# Patient Record
Sex: Female | Born: 1935
Health system: Southern US, Community
[De-identification: ages and names within clinical notes are randomized; demographics above are authoritative.]

## PROBLEM LIST (undated history)

## (undated) DIAGNOSIS — Z9889 Other specified postprocedural states: Secondary | ICD-10-CM

## (undated) DIAGNOSIS — I10 Essential (primary) hypertension: Secondary | ICD-10-CM

## (undated) DIAGNOSIS — T4145XA Adverse effect of unspecified anesthetic, initial encounter: Secondary | ICD-10-CM

## (undated) DIAGNOSIS — I499 Cardiac arrhythmia, unspecified: Secondary | ICD-10-CM

## (undated) DIAGNOSIS — Z923 Personal history of irradiation: Secondary | ICD-10-CM

## (undated) DIAGNOSIS — Z789 Other specified health status: Secondary | ICD-10-CM

## (undated) DIAGNOSIS — C50919 Malignant neoplasm of unspecified site of unspecified female breast: Secondary | ICD-10-CM

## (undated) DIAGNOSIS — C801 Malignant (primary) neoplasm, unspecified: Secondary | ICD-10-CM

## (undated) DIAGNOSIS — D649 Anemia, unspecified: Secondary | ICD-10-CM

## (undated) DIAGNOSIS — Z87442 Personal history of urinary calculi: Secondary | ICD-10-CM

## (undated) DIAGNOSIS — IMO0001 Reserved for inherently not codable concepts without codable children: Secondary | ICD-10-CM

## (undated) DIAGNOSIS — R112 Nausea with vomiting, unspecified: Secondary | ICD-10-CM

## (undated) DIAGNOSIS — J449 Chronic obstructive pulmonary disease, unspecified: Secondary | ICD-10-CM

## (undated) DIAGNOSIS — M810 Age-related osteoporosis without current pathological fracture: Secondary | ICD-10-CM

## (undated) DIAGNOSIS — T8859XA Other complications of anesthesia, initial encounter: Secondary | ICD-10-CM

## (undated) DIAGNOSIS — K219 Gastro-esophageal reflux disease without esophagitis: Secondary | ICD-10-CM

## (undated) DIAGNOSIS — M1990 Unspecified osteoarthritis, unspecified site: Secondary | ICD-10-CM

## (undated) HISTORY — DX: Other specified postprocedural states: Z98.890

## (undated) HISTORY — DX: Gastro-esophageal reflux disease without esophagitis: K21.9

## (undated) HISTORY — DX: Unspecified osteoarthritis, unspecified site: M19.90

## (undated) HISTORY — DX: Age-related osteoporosis without current pathological fracture: M81.0

## (undated) HISTORY — PX: CHOLECYSTECTOMY: SHX55

## (undated) HISTORY — DX: Essential (primary) hypertension: I10

## (undated) HISTORY — PX: COLONOSCOPY: SHX174

---

## 1964-06-20 DIAGNOSIS — Z9889 Other specified postprocedural states: Secondary | ICD-10-CM

## 1964-06-20 HISTORY — PX: BREAST BIOPSY: SHX20

## 1964-06-20 HISTORY — DX: Other specified postprocedural states: Z98.890

## 1964-06-20 HISTORY — PX: BREAST EXCISIONAL BIOPSY: SUR124

## 1999-06-21 HISTORY — PX: INNER EAR SURGERY: SHX679

## 2002-06-20 HISTORY — PX: ROTATOR CUFF REPAIR: SHX139

## 2003-06-21 HISTORY — PX: ANKLE SURGERY: SHX546

## 2003-07-26 ENCOUNTER — Other Ambulatory Visit: Payer: Self-pay

## 2003-09-16 ENCOUNTER — Other Ambulatory Visit: Payer: Self-pay

## 2005-03-16 ENCOUNTER — Ambulatory Visit: Payer: Self-pay | Admitting: Internal Medicine

## 2006-04-06 ENCOUNTER — Ambulatory Visit: Payer: Self-pay | Admitting: Internal Medicine

## 2007-04-10 ENCOUNTER — Ambulatory Visit: Payer: Self-pay | Admitting: Internal Medicine

## 2008-04-11 ENCOUNTER — Ambulatory Visit: Payer: Self-pay | Admitting: Internal Medicine

## 2008-10-01 ENCOUNTER — Ambulatory Visit: Payer: Self-pay | Admitting: Unknown Physician Specialty

## 2009-04-13 ENCOUNTER — Ambulatory Visit: Payer: Self-pay | Admitting: Internal Medicine

## 2010-05-28 ENCOUNTER — Ambulatory Visit: Payer: Self-pay | Admitting: Internal Medicine

## 2011-02-22 ENCOUNTER — Ambulatory Visit: Payer: Self-pay | Admitting: Unknown Physician Specialty

## 2011-02-23 LAB — PATHOLOGY REPORT

## 2011-02-24 ENCOUNTER — Ambulatory Visit: Payer: Self-pay | Admitting: Unknown Physician Specialty

## 2011-07-22 ENCOUNTER — Ambulatory Visit: Payer: Self-pay | Admitting: Specialist

## 2012-01-24 ENCOUNTER — Ambulatory Visit: Payer: Self-pay | Admitting: Specialist

## 2012-05-14 ENCOUNTER — Ambulatory Visit: Payer: Self-pay | Admitting: Internal Medicine

## 2012-09-05 ENCOUNTER — Ambulatory Visit: Payer: Self-pay | Admitting: Specialist

## 2013-07-25 ENCOUNTER — Ambulatory Visit: Payer: Self-pay | Admitting: Internal Medicine

## 2013-09-06 ENCOUNTER — Ambulatory Visit: Payer: Self-pay | Admitting: Unknown Physician Specialty

## 2013-09-06 ENCOUNTER — Ambulatory Visit: Payer: Self-pay | Admitting: Specialist

## 2013-12-03 ENCOUNTER — Emergency Department: Payer: Self-pay | Admitting: Emergency Medicine

## 2013-12-03 LAB — BASIC METABOLIC PANEL
ANION GAP: 7 (ref 7–16)
BUN: 8 mg/dL (ref 7–18)
CHLORIDE: 97 mmol/L — AB (ref 98–107)
Calcium, Total: 9.2 mg/dL (ref 8.5–10.1)
Co2: 31 mmol/L (ref 21–32)
Creatinine: 0.66 mg/dL (ref 0.60–1.30)
EGFR (African American): >60
EGFR (Non-African Amer.): >60
GLUCOSE: 106 mg/dL — AB (ref 65–99)
Osmolality: 269 (ref 275–301)
POTASSIUM: 3.1 mmol/L — AB (ref 3.5–5.1)
Sodium: 135 mmol/L — ABNORMAL LOW (ref 136–145)

## 2013-12-03 LAB — CBC
HCT: 38.2 % (ref 35.0–47.0)
HGB: 12.9 g/dL (ref 12.0–16.0)
MCH: 33.1 pg (ref 26.0–34.0)
MCHC: 33.8 g/dL (ref 32.0–36.0)
MCV: 98 fL (ref 80–100)
Platelet: 250 10*3/uL (ref 150–440)
RBC: 3.91 10*6/uL (ref 3.80–5.20)
RDW: 13.8 % (ref 11.5–14.5)
WBC: 5.8 10*3/uL (ref 3.6–11.0)

## 2013-12-03 LAB — TROPONIN I: Troponin-I: <0.02 ng/mL

## 2014-09-09 ENCOUNTER — Ambulatory Visit: Payer: Self-pay | Admitting: Internal Medicine

## 2015-11-19 ENCOUNTER — Other Ambulatory Visit: Payer: Self-pay | Admitting: Internal Medicine

## 2015-11-19 DIAGNOSIS — Z1231 Encounter for screening mammogram for malignant neoplasm of breast: Secondary | ICD-10-CM

## 2015-12-07 ENCOUNTER — Ambulatory Visit
Admission: RE | Admit: 2015-12-07 | Discharge: 2015-12-07 | Disposition: A | Discharge status: Home / Self Care | Payer: Medicare Other | Source: Ambulatory Visit | Attending: Internal Medicine | Admitting: Internal Medicine

## 2015-12-07 ENCOUNTER — Other Ambulatory Visit: Payer: Self-pay | Admitting: Internal Medicine

## 2015-12-07 DIAGNOSIS — R921 Mammographic calcification found on diagnostic imaging of breast: Secondary | ICD-10-CM | POA: Insufficient documentation

## 2015-12-07 DIAGNOSIS — Z1231 Encounter for screening mammogram for malignant neoplasm of breast: Secondary | ICD-10-CM

## 2015-12-10 ENCOUNTER — Other Ambulatory Visit: Payer: Self-pay | Admitting: Internal Medicine

## 2015-12-10 DIAGNOSIS — N6459 Other signs and symptoms in breast: Secondary | ICD-10-CM

## 2015-12-10 DIAGNOSIS — R921 Mammographic calcification found on diagnostic imaging of breast: Secondary | ICD-10-CM

## 2015-12-18 ENCOUNTER — Ambulatory Visit
Admission: RE | Admit: 2015-12-18 | Discharge: 2015-12-18 | Disposition: A | Discharge status: Home / Self Care | Payer: Medicare Other | Source: Ambulatory Visit | Attending: Internal Medicine | Admitting: Internal Medicine

## 2015-12-18 DIAGNOSIS — R6889 Other general symptoms and signs: Secondary | ICD-10-CM | POA: Diagnosis not present

## 2015-12-18 DIAGNOSIS — R921 Mammographic calcification found on diagnostic imaging of breast: Secondary | ICD-10-CM | POA: Diagnosis present

## 2015-12-18 DIAGNOSIS — N6459 Other signs and symptoms in breast: Secondary | ICD-10-CM

## 2016-04-18 DIAGNOSIS — M05751 Rheumatoid arthritis with rheumatoid factor of right hip without organ or systems involvement: Secondary | ICD-10-CM | POA: Insufficient documentation

## 2016-08-17 ENCOUNTER — Other Ambulatory Visit: Payer: Self-pay | Admitting: Internal Medicine

## 2016-08-17 DIAGNOSIS — R921 Mammographic calcification found on diagnostic imaging of breast: Secondary | ICD-10-CM

## 2016-12-29 ENCOUNTER — Ambulatory Visit
Admission: RE | Admit: 2016-12-29 | Discharge: 2016-12-29 | Disposition: A | Discharge status: Home / Self Care | Payer: Medicare Other | Source: Ambulatory Visit | Attending: Internal Medicine | Admitting: Internal Medicine

## 2016-12-29 DIAGNOSIS — N6311 Unspecified lump in the right breast, upper outer quadrant: Secondary | ICD-10-CM | POA: Insufficient documentation

## 2016-12-29 DIAGNOSIS — R921 Mammographic calcification found on diagnostic imaging of breast: Secondary | ICD-10-CM

## 2016-12-29 DIAGNOSIS — N6310 Unspecified lump in the right breast, unspecified quadrant: Secondary | ICD-10-CM | POA: Diagnosis present

## 2017-01-03 ENCOUNTER — Other Ambulatory Visit: Payer: Self-pay | Admitting: Internal Medicine

## 2017-01-03 DIAGNOSIS — N6310 Unspecified lump in the right breast, unspecified quadrant: Secondary | ICD-10-CM

## 2017-01-03 DIAGNOSIS — R928 Other abnormal and inconclusive findings on diagnostic imaging of breast: Secondary | ICD-10-CM

## 2017-01-06 ENCOUNTER — Ambulatory Visit
Admission: RE | Admit: 2017-01-06 | Discharge: 2017-01-06 | Disposition: A | Discharge status: Home / Self Care | Payer: Medicare Other | Source: Ambulatory Visit | Attending: Internal Medicine | Admitting: Internal Medicine

## 2017-01-06 DIAGNOSIS — R928 Other abnormal and inconclusive findings on diagnostic imaging of breast: Secondary | ICD-10-CM

## 2017-01-06 DIAGNOSIS — N6310 Unspecified lump in the right breast, unspecified quadrant: Secondary | ICD-10-CM | POA: Diagnosis present

## 2017-01-06 DIAGNOSIS — C50919 Malignant neoplasm of unspecified site of unspecified female breast: Secondary | ICD-10-CM

## 2017-01-06 DIAGNOSIS — D0511 Intraductal carcinoma in situ of right breast: Secondary | ICD-10-CM | POA: Diagnosis not present

## 2017-01-06 HISTORY — DX: Malignant neoplasm of unspecified site of unspecified female breast: C50.919

## 2017-01-06 HISTORY — PX: BREAST BIOPSY: SHX20

## 2017-01-09 LAB — SURGICAL PATHOLOGY

## 2017-01-12 ENCOUNTER — Encounter: Payer: Self-pay | Admitting: *Deleted

## 2017-01-12 NOTE — Progress Notes (Signed)
  Oncology Nurse Navigator Documentation  Navigator Location: [facility] (01/12/17 0900)   )Navigator Encounter Type: Introductory phone call (01/12/17 0900)   Abnormal Finding Date: 12/29/16 (01/12/17 0900) Confirmed Diagnosis Date: 01/09/17 (01/12/17 0900)                   Barriers/Navigation Needs: Coordination of Care;Education (01/12/17 0900) Education: Newly Diagnosed Cancer Education (01/12/17 0900) Interventions: Coordination of Care (01/12/17 0900)   Coordination of Care: Appts (01/12/17 0900)                  Time Spent with Patient: 45 (01/12/17 0900)   Patient is newly diagnosed with DCIS of the right breast.  Called to establish navigation services.  Patient would like to see Dr. Bary Castilla for her surgical consultation.  Patient is scheduled to see Dr. Bary Castilla on 01/16/17 at 1:00.  She is to arrive 30 minutes early to complete paperwork.  She is to take a photo ID and all her meds with her to the appointment. Will take breast cancer educational literature, "My Breast Cancer Treatment Handbook" by Josephine Igo, RN to his office for her to pick up.  Dr. Linton Ham office notified of her appointment.

## 2017-01-16 ENCOUNTER — Ambulatory Visit (INDEPENDENT_AMBULATORY_CARE_PROVIDER_SITE_OTHER): Payer: Medicare Other | Admitting: General Surgery

## 2017-01-16 ENCOUNTER — Encounter: Payer: Self-pay | Admitting: General Surgery

## 2017-01-16 VITALS — BP 120/86 | HR 80 | Resp 14 | Ht 63.0 in | Wt 134.0 lb

## 2017-01-16 DIAGNOSIS — D0511 Intraductal carcinoma in situ of right breast: Secondary | ICD-10-CM

## 2017-01-16 NOTE — Progress Notes (Signed)
Patient ID: Kristina Medina, female   DOB: 1932-09-15, 83 y.o.   MRN: [id number]  Chief Complaint  Patient presents with  . Other    breast cancer     HPI Kristina Medina is a 83 y.o. female here for a discussion regarding her diagnosis of right breast cancer. Her last mammogram was 12/29/16 with a right breast biopsy done on 01/06/17. She states that she felt a lump in the right breast 2 weeks prior to her mammogram. She denies any change in size, pain, or drainage.   She is here with her friend, Economist.   Sister is Gonzales.  HPI  Past Medical History:  Diagnosis Date  . Arthritis   . GERD (gastroesophageal reflux disease)   . Hypertension   . Osteoporosis   . S/P right breast biopsy 1966    Past Surgical History:  Procedure Laterality Date  . ANKLE SURGERY Right 2005  . BREAST BIOPSY Right 1966   neg  . BREAST BIOPSY Right 01/06/2017   [place] core bx  . COLONOSCOPY    . ROTATOR CUFF REPAIR  2004    Family History  Problem Relation Age of Onset  . Breast cancer Sister 67  . Breast cancer Cousin   . Melanoma Brother   . Lupus Sister     Social History Social History  Substance Use Topics  . Smoking status: Former Smoker    Quit date: 1984  . Smokeless tobacco: Never Used  . Alcohol use No    Allergies not on file  Current Outpatient Prescriptions  Medication Sig Dispense Refill  . alendronate (FOSAMAX) 70 MG tablet Take 70 mg by mouth once a week. Take with a full glass of water on an empty stomach.    Marland Kitchen aspirin 81 MG chewable tablet Chew 81 mg by mouth daily.    . cetirizine (ZYRTEC) 10 MG tablet Take 10 mg by mouth daily.    Marland Kitchen diltiazem (DILACOR XR) 240 MG 24 hr capsule Take 240 mg by mouth daily.    . ergocalciferol (VITAMIN D2) 50000 units capsule Take 50,000 Units by mouth once a week.    . esomeprazole (NEXIUM) 40 MG capsule Take 40 mg by mouth daily at 12 noon.    . folic acid (FOLVITE) 1 MG tablet Take 1 mg by mouth daily.    .  hydrochlorothiazide (HYDRODIURIL) 25 MG tablet Take 25 mg by mouth daily.    . methotrexate 2.5 MG tablet Take by mouth once a week. Take 10 tablets every week    . mirtazapine (REMERON) 15 MG tablet Take 15 mg by mouth at bedtime.    . potassium chloride SA (K-DUR,KLOR-CON) 20 MEQ tablet Take 20 mEq by mouth daily.     No current facility-administered medications for this visit.     Review of Systems Review of Systems  Constitutional: Negative.   Respiratory: Negative.   Cardiovascular: Negative.     Blood pressure 120/86, pulse 80, resp. rate 14, height '5\' 3"'  (1.6 m), weight 134 lb (60.8 kg).  Physical Exam Physical Exam  Constitutional: She is oriented to person, place, and time. She appears well-developed and well-nourished.  Eyes: Conjunctivae are normal. No scleral icterus.  Neck: Neck supple.  Cardiovascular: Normal rate, regular rhythm and normal heart sounds.   Pulmonary/Chest: Effort normal and breath sounds normal. Right breast exhibits mass (2cm mass 10-11 o'clock). Right breast exhibits no inverted nipple, no nipple discharge, no skin change and no tenderness. Left breast  exhibits no inverted nipple, no mass, no nipple discharge, no skin change and no tenderness.    Abdominal: Soft. Bowel sounds are normal. There is no tenderness.  Lymphadenopathy:    She has no cervical adenopathy.    She has no axillary adenopathy.       Right: No supraclavicular adenopathy present.  Neurological: She is alert and oriented to person, place, and time.  Skin: Skin is warm and dry.    Data Reviewed 2016 through 2018 mammograms reviewed. July 2018 right breast ultrasound reviewed. Rapid development of calcifications in the upper-outer quadrant of the right breast, now with associated palpable mass. BI-RADS-4.  DIAGNOSIS:  A. BREAST, RIGHT, 11 O'CLOCK 4 CM FROM NIPPLE; ULTRASOUND-GUIDED CORE  BIOPSY:  - APOCRINE DUCTAL CARCINOMA IN SITU (DCIS), AT LEAST, WITH HIGH NUCLEAR  GRADE,  ASSOCIATED WITH CALCIFICATIONS AND SCLEROSIS, SEE COMMENT.   Comment:  The neoplasm exhibits papillary features. Necrosis is present accounting for most of the calcifications.  ER, PR, and HER2 studies are deferred to the excised specimen.   CBC dated 01/11/2017 showed a hemoglobin of 12.8 with an MCV of 95, white blood cell count 5600, platelet count 218,000.  Comprehensive metabolic panel was notable for potassium of 3.3, potassium sodium 136, blood sugar 147, creatinine 0.7 with an estimated GFR of 97.  Assessment    Papillary DCIS of the upper-outer quadrant of the right breast.  Mild hypokalemia, likely secondary to diuretic therapy.    Plan    Based on the identification of a palpable mass, high nuclear grade and location and the anticipated pathway of lymphatics, recommended a sentinel node biopsy be completed at the time of wide excision.  The patient will be asked to increase her potassium supplement to a twice a day schedule.     HPI, Physical Exam, Assessment and Plan have been scribed under the direction and in the presence of Hervey Ard, MD.  Gaspar Cola, CMA . I have completed the exam and reviewed the above documentation for accuracy and completeness.  I agree with the above.  Haematologist has been used and any errors in dictation or transcription are unintentional.  Hervey Ard, M.D., F.A.C.S.  Robert Bellow 01/16/2017, 9:14 PM  Patient's surgery has been scheduled for 01-31-17 at [facility]. It is okay for patient to continue an 81 mg aspirin once daily.   Dominga Ferry, CMA

## 2017-01-17 ENCOUNTER — Telehealth: Payer: Self-pay

## 2017-01-17 NOTE — Telephone Encounter (Signed)
Patient notified to increase her potassium supplement to twice daily until surgery. She will arrive at the Radiology desk at [facility] on 01/31/17 at 8:15 am. The patient is aware of date, time, and instruction.

## 2017-01-17 NOTE — Telephone Encounter (Signed)
-----   Message from Robert Bellow, MD sent at 01/16/2017  9:22 PM EDT ----- Please contact the patient and ask her to increase her potassium supplement to a twice a day schedule until surgery. Thank you

## 2017-01-19 ENCOUNTER — Other Ambulatory Visit: Payer: Self-pay | Admitting: *Deleted

## 2017-01-23 ENCOUNTER — Encounter
Admission: RE | Admit: 2017-01-23 | Discharge: 2017-01-23 | Disposition: A | Discharge status: Home / Self Care | Payer: Medicare Other | Source: Ambulatory Visit | Attending: General Surgery | Admitting: General Surgery

## 2017-01-23 HISTORY — DX: Cardiac arrhythmia, unspecified: I49.9

## 2017-01-23 HISTORY — DX: Personal history of urinary calculi: Z87.442

## 2017-01-23 HISTORY — DX: Adverse effect of unspecified anesthetic, initial encounter: T41.45XA

## 2017-01-23 HISTORY — DX: Anemia, unspecified: D64.9

## 2017-01-23 HISTORY — DX: Other complications of anesthesia, initial encounter: T88.59XA

## 2017-01-23 HISTORY — DX: Malignant (primary) neoplasm, unspecified: C80.1

## 2017-01-23 HISTORY — DX: Nausea with vomiting, unspecified: Z98.890

## 2017-01-23 HISTORY — DX: Other specified postprocedural states: R11.2

## 2017-01-23 NOTE — Patient Instructions (Addendum)
  Your procedure is scheduled on: 01-31-17 TUESDAY Report to [facility] (2ND DESK ON RIGHT) @ 8:15 AM  Remember: Instructions that are not followed completely may result in serious medical risk, up to and including death, or upon the discretion of your surgeon and anesthesiologist your surgery may need to be rescheduled.    _x___ 1. Do not eat food or drink liquids after midnight. No gum chewing or hard candies.     __x__ 2. No Alcohol for 24 hours before or after surgery.   __x__3. No Smoking for 24 prior to surgery.   ____  4. Bring all medications with you on the day of surgery if instructed.    __x__ 5. Notify your doctor if there is any change in your medical condition     (cold, fever, infections).     Do not wear jewelry, make-up, hairpins, clips or nail polish.  Do not wear lotions, powders, or perfumes. You may wear deodorant.  Do not shave 48 hours prior to surgery. Men may shave face and neck.  Do not bring valuables to the hospital.    [facility] is not responsible for any belongings or valuables.               Contacts, dentures or bridgework may not be worn into surgery.  Leave your suitcase in the car. After surgery it may be brought to your room.  For patients admitted to the hospital, discharge time is determined by your treatment team.   Patients discharged the day of surgery will not be allowed to drive home.  You will need someone to drive you home and stay with you the night of your procedure.    Please read over the following fact sheets that you were given:     _x___ [place] WITH A SMALL SIP OF WATER. These include:  1. DILTIAZEM  2. [place]  3. TAKE AN EXTRA NEXIUM Monday NIGHT BEFORE BED (01-30-17)  4.  5.  6.  ____Fleets enema or Magnesium Citrate as directed.   _x___ Use CHG Soap or sage wipes as directed on instruction sheet   ____ Use inhalers on the day of surgery and bring to  hospital day of surgery  ____ Stop Metformin and Janumet 2 days prior to surgery.    ____ Take 1/2 of usual insulin dose the night before surgery and none on the morning surgery.   _x___ Follow recommendations from Cardiologist, Pulmonologist or PCP regarding stopping Aspirin, Coumadin, Pllavix ,Eliquis, Effient, or Pradaxa, and Pletal-OK TO CONTINUE 81 MG ASPIRIN-DO NOT TAKE AM OF SURGERY  ____Stop Anti-inflammatories such as Advil, Aleve, Ibuprofen, Motrin, Naproxen, Naprosyn, Goodies powders or aspirin products. OK to take Tylenol    ____ Stop supplements until after surgery.     ____ Bring C-Pap to the hospital.

## 2017-01-26 ENCOUNTER — Encounter
Admission: RE | Admit: 2017-01-26 | Discharge: 2017-01-26 | Disposition: A | Discharge status: Home / Self Care | Payer: Medicare HMO | Source: Ambulatory Visit | Attending: General Surgery | Admitting: General Surgery

## 2017-01-26 DIAGNOSIS — Z01812 Encounter for preprocedural laboratory examination: Secondary | ICD-10-CM | POA: Insufficient documentation

## 2017-01-26 DIAGNOSIS — D0511 Intraductal carcinoma in situ of right breast: Secondary | ICD-10-CM | POA: Insufficient documentation

## 2017-01-26 DIAGNOSIS — I4510 Unspecified right bundle-branch block: Secondary | ICD-10-CM | POA: Insufficient documentation

## 2017-01-26 DIAGNOSIS — I10 Essential (primary) hypertension: Secondary | ICD-10-CM | POA: Diagnosis not present

## 2017-01-26 DIAGNOSIS — Z01810 Encounter for preprocedural cardiovascular examination: Secondary | ICD-10-CM | POA: Insufficient documentation

## 2017-01-26 DIAGNOSIS — I517 Cardiomegaly: Secondary | ICD-10-CM | POA: Diagnosis not present

## 2017-01-26 LAB — POTASSIUM: POTASSIUM: 3.4 mmol/L — AB (ref 3.5–5.1)

## 2017-01-26 NOTE — Pre-Procedure Instructions (Signed)
SPOKE WITH DR [place] EKG-DR [place] EKG WITH 2015 EKG AND IT SHOWED NO CHANGE-OK TO PROCEED WITH SURGERY PER DR Marcello Moores

## 2017-01-27 ENCOUNTER — Telehealth: Payer: Self-pay | Admitting: General Surgery

## 2017-01-27 NOTE — Telephone Encounter (Signed)
K+ 3.4. Patient asked to take her 31 mWq KCL supplement BID until August 14th surgery.

## 2017-01-30 ENCOUNTER — Encounter: Payer: Self-pay | Admitting: *Deleted

## 2017-01-31 ENCOUNTER — Encounter: Payer: Self-pay | Admitting: *Deleted

## 2017-01-31 ENCOUNTER — Encounter
Admission: RE | Disposition: A | Discharge status: Home / Self Care | Payer: Self-pay | Source: Ambulatory Visit | Attending: General Surgery

## 2017-01-31 ENCOUNTER — Ambulatory Visit
Admission: RE | Admit: 2017-01-31 | Discharge: 2017-01-31 | Disposition: A | Discharge status: Home / Self Care | Payer: Medicare HMO | Source: Ambulatory Visit | Attending: General Surgery | Admitting: General Surgery

## 2017-01-31 ENCOUNTER — Ambulatory Visit: Payer: Medicare HMO | Admitting: Anesthesiology

## 2017-01-31 ENCOUNTER — Ambulatory Visit: Payer: Medicare HMO | Admitting: Certified Registered"

## 2017-01-31 DIAGNOSIS — Z7982 Long term (current) use of aspirin: Secondary | ICD-10-CM | POA: Insufficient documentation

## 2017-01-31 DIAGNOSIS — Z87442 Personal history of urinary calculi: Secondary | ICD-10-CM | POA: Diagnosis not present

## 2017-01-31 DIAGNOSIS — D241 Benign neoplasm of right breast: Secondary | ICD-10-CM | POA: Insufficient documentation

## 2017-01-31 DIAGNOSIS — Z9049 Acquired absence of other specified parts of digestive tract: Secondary | ICD-10-CM | POA: Insufficient documentation

## 2017-01-31 DIAGNOSIS — M810 Age-related osteoporosis without current pathological fracture: Secondary | ICD-10-CM | POA: Diagnosis not present

## 2017-01-31 DIAGNOSIS — Z853 Personal history of malignant neoplasm of breast: Secondary | ICD-10-CM

## 2017-01-31 DIAGNOSIS — M1990 Unspecified osteoarthritis, unspecified site: Secondary | ICD-10-CM | POA: Insufficient documentation

## 2017-01-31 DIAGNOSIS — I10 Essential (primary) hypertension: Secondary | ICD-10-CM | POA: Insufficient documentation

## 2017-01-31 DIAGNOSIS — D649 Anemia, unspecified: Secondary | ICD-10-CM | POA: Diagnosis not present

## 2017-01-31 DIAGNOSIS — Z79899 Other long term (current) drug therapy: Secondary | ICD-10-CM | POA: Insufficient documentation

## 2017-01-31 DIAGNOSIS — D0511 Intraductal carcinoma in situ of right breast: Secondary | ICD-10-CM | POA: Insufficient documentation

## 2017-01-31 DIAGNOSIS — Z87891 Personal history of nicotine dependence: Secondary | ICD-10-CM | POA: Insufficient documentation

## 2017-01-31 DIAGNOSIS — C50411 Malignant neoplasm of upper-outer quadrant of right female breast: Secondary | ICD-10-CM | POA: Diagnosis not present

## 2017-01-31 DIAGNOSIS — K219 Gastro-esophageal reflux disease without esophagitis: Secondary | ICD-10-CM | POA: Insufficient documentation

## 2017-01-31 DIAGNOSIS — C801 Malignant (primary) neoplasm, unspecified: Secondary | ICD-10-CM

## 2017-01-31 DIAGNOSIS — C50911 Malignant neoplasm of unspecified site of right female breast: Secondary | ICD-10-CM | POA: Diagnosis not present

## 2017-01-31 HISTORY — PX: BREAST LUMPECTOMY WITH SENTINEL LYMPH NODE BIOPSY: SHX5597

## 2017-01-31 HISTORY — PX: BREAST LUMPECTOMY: SHX2

## 2017-01-31 HISTORY — PX: BREAST EXCISIONAL BIOPSY: SUR124

## 2017-01-31 HISTORY — DX: Reserved for inherently not codable concepts without codable children: IMO0001

## 2017-01-31 HISTORY — DX: Other specified health status: Z78.9

## 2017-01-31 HISTORY — DX: Malignant (primary) neoplasm, unspecified: C80.1

## 2017-01-31 LAB — POCT I-STAT 4, (NA,K, GLUC, HGB,HCT)
GLUCOSE: 97 mg/dL (ref 65–99)
HEMATOCRIT: 42 % (ref 36.0–46.0)
HEMOGLOBIN: 14.3 g/dL (ref 12.0–15.0)
Potassium: 4.8 mmol/L (ref 3.5–5.1)
SODIUM: 136 mmol/L (ref 135–145)

## 2017-01-31 SURGERY — BREAST LUMPECTOMY WITH SENTINEL LYMPH NODE BX
Anesthesia: General | Laterality: Right | Wound class: Clean

## 2017-01-31 MED ORDER — METHYLENE BLUE 0.5 % INJ SOLN
INTRAVENOUS | Status: AC
  Filled 2017-01-31: qty 10

## 2017-01-31 MED ORDER — DEXAMETHASONE SODIUM PHOSPHATE 10 MG/ML IJ SOLN
INTRAMUSCULAR | Status: AC
  Filled 2017-01-31: qty 1

## 2017-01-31 MED ORDER — ACETAMINOPHEN 10 MG/ML IV SOLN
INTRAVENOUS | Status: DC | PRN
  Administered 2017-01-31: 1000 mg via INTRAVENOUS

## 2017-01-31 MED ORDER — FENTANYL CITRATE (PF) 100 MCG/2ML IJ SOLN
INTRAMUSCULAR | Status: DC | PRN
  Administered 2017-01-31 (×3): 25 ug via INTRAVENOUS
  Administered 2017-01-31: 50 ug via INTRAVENOUS

## 2017-01-31 MED ORDER — METHYLENE BLUE 0.5 % INJ SOLN
INTRAVENOUS | Status: DC | PRN
  Administered 2017-01-31: 5 mL via SUBMUCOSAL

## 2017-01-31 MED ORDER — LACTATED RINGERS IV SOLN
INTRAVENOUS | Status: DC
  Administered 2017-01-31: 10:00:00 via INTRAVENOUS

## 2017-01-31 MED ORDER — ONDANSETRON HCL 4 MG/2ML IJ SOLN
INTRAMUSCULAR | Status: DC | PRN
  Administered 2017-01-31: 4 mg via INTRAVENOUS

## 2017-01-31 MED ORDER — BUPIVACAINE-EPINEPHRINE (PF) 0.5% -1:200000 IJ SOLN
INTRAMUSCULAR | Status: DC | PRN
  Administered 2017-01-31: 30 mL via PERINEURAL

## 2017-01-31 MED ORDER — ONDANSETRON HCL 4 MG/2ML IJ SOLN
INTRAMUSCULAR | Status: AC
  Filled 2017-01-31: qty 2

## 2017-01-31 MED ORDER — ONDANSETRON HCL 4 MG/2ML IJ SOLN: 4.0000 mg | Freq: Once | INTRAMUSCULAR | Status: DC | PRN

## 2017-01-31 MED ORDER — BUPIVACAINE-EPINEPHRINE (PF) 0.5% -1:200000 IJ SOLN
INTRAMUSCULAR | Status: AC
  Filled 2017-01-31: qty 30

## 2017-01-31 MED ORDER — LIDOCAINE HCL (PF) 2 % IJ SOLN
INTRAMUSCULAR | Status: AC
  Filled 2017-01-31: qty 2

## 2017-01-31 MED ORDER — LIDOCAINE HCL (CARDIAC) 20 MG/ML IV SOLN
INTRAVENOUS | Status: DC | PRN
  Administered 2017-01-31: 50 mg via INTRAVENOUS

## 2017-01-31 MED ORDER — FENTANYL CITRATE (PF) 100 MCG/2ML IJ SOLN
INTRAMUSCULAR | Status: AC
  Filled 2017-01-31: qty 2

## 2017-01-31 MED ORDER — PROPOFOL 10 MG/ML IV BOLUS
INTRAVENOUS | Status: DC | PRN
  Administered 2017-01-31: 20 mg via INTRAVENOUS
  Administered 2017-01-31: 100 mg via INTRAVENOUS

## 2017-01-31 MED ORDER — PROPOFOL 10 MG/ML IV BOLUS
INTRAVENOUS | Status: AC
  Filled 2017-01-31: qty 20

## 2017-01-31 MED ORDER — ACETAMINOPHEN 10 MG/ML IV SOLN
INTRAVENOUS | Status: AC
  Filled 2017-01-31: qty 100

## 2017-01-31 MED ORDER — DEXAMETHASONE SODIUM PHOSPHATE 10 MG/ML IJ SOLN
INTRAMUSCULAR | Status: DC | PRN
  Administered 2017-01-31: 4 mg via INTRAVENOUS

## 2017-01-31 MED ORDER — FENTANYL CITRATE (PF) 100 MCG/2ML IJ SOLN: 25.0000 ug | INTRAMUSCULAR | Status: DC | PRN

## 2017-01-31 MED ORDER — HYDROCODONE-ACETAMINOPHEN 5-325 MG PO TABS: 1.0000 | ORAL_TABLET | ORAL | 0 refills | Status: DC | PRN

## 2017-01-31 MED ORDER — TECHNETIUM TC 99M SULFUR COLLOID FILTERED
0.9240 | Freq: Once | INTRAVENOUS | Status: AC | PRN
  Administered 2017-01-31: 0.924 via INTRADERMAL

## 2017-01-31 SURGICAL SUPPLY — 55 items
BANDAGE ELASTIC 6 LF NS (GAUZE/BANDAGES/DRESSINGS) ×3 IMPLANT
BENZOIN TINCTURE PRP APPL 2/3 (GAUZE/BANDAGES/DRESSINGS) ×3 IMPLANT
BLADE SURG 15 STRL SS SAFETY (BLADE) ×6 IMPLANT
BNDG GAUZE 4.5X4.1 6PLY STRL (MISCELLANEOUS) ×3 IMPLANT
BRA SURG CMPR BEIGE SM SILKY (MISCELLANEOUS) ×1
BRA SURG PR BEIGE SM SILKY (MISCELLANEOUS) ×2 IMPLANT
BULB RESERV EVAC DRAIN JP 100C (MISCELLANEOUS) IMPLANT
CANISTER SUCT 1200ML W/VALVE (MISCELLANEOUS) ×3 IMPLANT
CHLORAPREP W/TINT 26ML (MISCELLANEOUS) ×3 IMPLANT
CLOSURE WOUND 1/2 X4 (GAUZE/BANDAGES/DRESSINGS) ×1
CNTNR SPEC 2.5X3XGRAD LEK (MISCELLANEOUS) ×1
CONT SPEC 4OZ STER OR WHT (MISCELLANEOUS) ×2
CONTAINER SPEC 2.5X3XGRAD LEK (MISCELLANEOUS) ×1 IMPLANT
COVER PROBE FLX POLY STRL (MISCELLANEOUS) ×3 IMPLANT
DEVICE DUBIN SPECIMEN MAMMOGRA (MISCELLANEOUS) ×3 IMPLANT
DRAIN CHANNEL JP 15F RND 16 (MISCELLANEOUS) IMPLANT
DRAPE LAPAROTOMY TRNSV 106X77 (MISCELLANEOUS) ×3 IMPLANT
DRSG TELFA 3X8 NADH (GAUZE/BANDAGES/DRESSINGS) ×3 IMPLANT
DRSG TELFA 4X3 1S NADH ST (GAUZE/BANDAGES/DRESSINGS) ×3 IMPLANT
ELECT CAUTERY BLADE TIP 2.5 (TIP) ×3
ELECT REM PT RETURN 9FT ADLT (ELECTROSURGICAL) ×3
ELECTRODE CAUTERY BLDE TIP 2.5 (TIP) ×1 IMPLANT
ELECTRODE REM PT RTRN 9FT ADLT (ELECTROSURGICAL) ×1 IMPLANT
GAUZE FLUFF 18X24 1PLY STRL (GAUZE/BANDAGES/DRESSINGS) ×3 IMPLANT
GAUZE SPONGE 4X4 12PLY STRL (GAUZE/BANDAGES/DRESSINGS) ×3 IMPLANT
GLOVE BIO SURGEON STRL SZ7.5 (GLOVE) ×3 IMPLANT
GLOVE INDICATOR 8.0 STRL GRN (GLOVE) ×3 IMPLANT
GOWN STRL REUS W/ TWL LRG LVL3 (GOWN DISPOSABLE) ×2 IMPLANT
GOWN STRL REUS W/TWL LRG LVL3 (GOWN DISPOSABLE) ×4
KIT RM TURNOVER STRD PROC AR (KITS) ×3 IMPLANT
LABEL OR SOLS (LABEL) ×3 IMPLANT
MARGIN MAP 10MM (MISCELLANEOUS) ×3 IMPLANT
NDL SAFETY 22GX1.5 (NEEDLE) ×3 IMPLANT
NEEDLE HYPO 25X1 1.5 SAFETY (NEEDLE) ×6 IMPLANT
PACK BASIN MINOR ARMC (MISCELLANEOUS) ×3 IMPLANT
SHEARS FOC LG CVD HARMONIC 17C (MISCELLANEOUS) IMPLANT
SHEARS HARMONIC 9CM CVD (BLADE) ×3 IMPLANT
SLEVE PROBE SENORX GAMMA FIND (MISCELLANEOUS) ×3 IMPLANT
STRIP CLOSURE SKIN 1/2X4 (GAUZE/BANDAGES/DRESSINGS) ×2 IMPLANT
SUT ETHILON 3-0 FS-10 30 BLK (SUTURE) ×3
SUT SILK 2 0 (SUTURE) ×2
SUT SILK 2-0 18XBRD TIE 12 (SUTURE) ×1 IMPLANT
SUT VIC AB 2-0 CT1 27 (SUTURE) ×6
SUT VIC AB 2-0 CT1 TAPERPNT 27 (SUTURE) ×3 IMPLANT
SUT VIC AB 3-0 SH 27 (SUTURE) ×4
SUT VIC AB 3-0 SH 27X BRD (SUTURE) ×2 IMPLANT
SUT VIC AB 4-0 FS2 27 (SUTURE) ×6 IMPLANT
SUT VICRYL+ 3-0 144IN (SUTURE) ×3 IMPLANT
SUTURE EHLN 3-0 FS-10 30 BLK (SUTURE) ×1 IMPLANT
SWABSTK COMLB BENZOIN TINCTURE (MISCELLANEOUS) ×3 IMPLANT
SYR BULB IRRIG 60ML STRL (SYRINGE) ×3 IMPLANT
SYR CONTROL 10ML (SYRINGE) ×3 IMPLANT
SYRINGE 10CC LL (SYRINGE) ×3 IMPLANT
TAPE TRANSPORE STRL 2 31045 (GAUZE/BANDAGES/DRESSINGS) ×3 IMPLANT
WATER STERILE IRR 1000ML POUR (IV SOLUTION) ×3 IMPLANT

## 2017-01-31 NOTE — Op Note (Signed)
Preoperative diagnosis: High-grade DCIS right breast.  Postoperative diagnosis: Same.  Operative procedure: Wide local excision with mastoplasty, sentinel node biopsy.  Operating surgeon: Hervey Ard, M.D.  Anesthesia: Gen. by LMA, Marcaine 0.5% with 1-200,000 epinephrine, 30 mL.  Estimated blood loss: Less than 10 mL.  Clinical note: This 83 year old presented with a palpable mass in the upper-outer quadrant of the right breast and mammogram showing a large grouping of microcalcifications. Preoperative eyedrops he showed evidence of high-grade DCIS. The patient desired breast conservation. Due to the location of the lesion in the upper-outer quadrant was elected complete a sentinel node biopsy at the same time as lymphatic channels would likely be divided during wide excision.  Operative note: With the patient under adequate general anesthesia the area was prepped with ChloraPrep and draped after injecting 5 mL of 0.5% methylene blue in the subareolar plexus. Marcaine was infiltrated for postoperative analgesia. Ultrasound was used to identify the borders of the area of dense calcification and the edges of the palpable mass. This encompassed a 4 x 4 by 3 cm block of tissue. These skin was incised sharply and a curvilinear incision in the upper-outer quadrant about 4 cm from the nipple. The skin flaps were elevated and the above-mentioned tissue block dissected down to and including the deep fascia. The specimen was orientated with a medial marker appropriately applied and the cranial marker inappropriately applied at on the actual anterior border. This information was delivered by phone to the pathologist. Specimen radiograph confirmed the area of previous clip placement and the mass of microcalcifications extending toward the deep margin. Telephone report from pathology showed there was an adequate 2 mm margin and again the pectoralis fascia was included.  While pathology was reviewing the  breast specimen attention was turned to the axilla. The node seeker device was used to identify area of increased uptake. This could be approached the superior aspect of the wide excision site. A 1 cm hot, blue node was removed as well as 2 smaller hot, non-blue nodes. These were sent for routine histology. Hemostasis was electrocautery. The breast and pectoralis fashion was then elevated off the underlying pectoralis muscle and off the serratus muscle laterally. This was then approximated with interrupted 2-0 Vicryl figure-of-eight sutures. The deep breast parenchyma was then approximated with another layer of interrupted 2-0 Vicryl figure-of-eight sutures. The skin was closed with a running 3-0 Vicryl subcuticular suture. Benzoin, Steri-Strips, Telfa and fluff gauze dressing followed by surgical bra.  The patient tolerated procedure well was taken recovery room in stable condition.

## 2017-01-31 NOTE — Anesthesia Post-op Follow-up Note (Signed)
Anesthesia QCDR form completed.        

## 2017-01-31 NOTE — H&P (Signed)
Kristina Medina [id number] 06-Oct-1932     HPI: Healthy 83 y.o with high grade DCIS of the UOQ of the right breast. Desires breast conservation. Reports she has been well since last office visit.   Prescriptions Prior to Admission  Medication Sig Dispense Refill Last Dose  . alendronate (FOSAMAX) 70 MG tablet Take 70 mg by mouth every Friday. Take with a full glass of water on an empty stomach.    01/27/2017  . aspirin EC 81 MG tablet Take 81 mg by mouth daily.   01/29/2017  . Calcium Carb-Cholecalciferol (CALCIUM 600+D3 PO) Take 1 tablet by mouth 2 (two) times daily.   01/29/2017  . cetirizine (ZYRTEC) 10 MG tablet Take 10 mg by mouth daily.   01/30/2017 at Unknown time  . diltiazem (DILACOR XR) 240 MG 24 hr capsule Take 240 mg by mouth every morning.    01/31/2017 at 0700  . ergocalciferol (VITAMIN D2) 50000 units capsule Take 50,000 Units by mouth every Friday.    01/27/2017  . esomeprazole (NEXIUM) 40 MG capsule Take 40 mg by mouth every morning.    01/31/2017 at 0700  . folic acid (FOLVITE) 1 MG tablet Take 1 mg by mouth daily.   01/30/2017  . hydrochlorothiazide (HYDRODIURIL) 25 MG tablet Take 25 mg by mouth daily.   01/30/2017  . methotrexate 2.5 MG tablet Take 12.5 mg by mouth 2 (two) times a week. Takes on Thursday and Friday   01/27/2017  . mirtazapine (REMERON) 15 MG tablet Take 15 mg by mouth at bedtime.   01/29/2017  . Multiple Vitamin (MULTIVITAMIN WITH MINERALS) TABS tablet Take 1 tablet by mouth daily.   01/30/2017  . potassium chloride SA (K-DUR,KLOR-CON) 20 MEQ tablet Take 20 mEq by mouth 2 (two) times daily.    01/30/2017   No Known Allergies Past Medical History:  Diagnosis Date  . Anemia   . Arthritis   . Cancer ([place])   . Complication of anesthesia   . GERD (gastroesophageal reflux disease)   . History of kidney stones   . Hypertension   . Irregular heart beat   . Osteoporosis   . Patient is Jehovah's Witness   . PONV (postoperative nausea and vomiting)    WITH  GALLBLADDER SURGERY  . S/P right breast biopsy 1966   Past Surgical History:  Procedure Laterality Date  . ANKLE SURGERY Right 2005  . BREAST BIOPSY Right 1966   neg  . BREAST BIOPSY Right 01/06/2017   [place] core bx  . CHOLECYSTECTOMY    . COLONOSCOPY    . ROTATOR CUFF REPAIR  2004   Social History   Social History  . Marital status: Single    Spouse name: N/A  . Number of children: N/A  . Years of education: N/A   Occupational History  . Not on file.   Social History Main Topics  . Smoking status: Former Smoker    Packs/day: 0.25    Years: 29.00    Types: Cigarettes    Quit date: 1984  . Smokeless tobacco: Never Used  . Alcohol use No  . Drug use: No  . Sexual activity: Not on file   Other Topics Concern  . Not on file   Social History Narrative  . No narrative on file   Social History   Social History Narrative  . No narrative on file     ROS: Negative.     PE: HEENT: Negative. Lungs: Clear. Cardio: RR. Robert Bellow 01/31/2017  Assessment/Plan:  Proceed with planned wide excision, SLN biopsy of the right breast.

## 2017-01-31 NOTE — Anesthesia Procedure Notes (Signed)
Procedure Name: LMA Insertion Performed by: Michell Kader Pre-anesthesia Checklist: Patient identified, Patient being monitored, Timeout performed, Emergency Drugs available and Suction available Patient Re-evaluated:Patient Re-evaluated prior to induction Oxygen Delivery Method: Circle system utilized Preoxygenation: Pre-oxygenation with 100% oxygen Induction Type: IV induction Ventilation: Mask ventilation without difficulty LMA: LMA inserted LMA Size: 3.5 Tube type: Oral Number of attempts: 1 Placement Confirmation: positive ETCO2 and breath sounds checked- equal and bilateral Tube secured with: Tape Dental Injury: Teeth and Oropharynx as per pre-operative assessment        

## 2017-01-31 NOTE — Anesthesia Postprocedure Evaluation (Signed)
Anesthesia Post Note  Patient: Kristina Medina  Procedure(s) Performed: Procedure(s) (LRB): BREAST LUMPECTOMY WITH SENTINEL LYMPH NODE BX (Right)  Patient location during evaluation: PACU Anesthesia Type: General Level of consciousness: awake and alert Pain management: pain level controlled Vital Signs Assessment: post-procedure vital signs reviewed and stable Respiratory status: spontaneous breathing and respiratory function stable Cardiovascular status: stable Anesthetic complications: no     Last Vitals:  Vitals:   01/31/17 0902 01/31/17 1143  BP: (!) 167/75 (!) 113/58  Pulse: 71 65  Resp: 16 16  Temp: 36.9 C 36.4 C  SpO2: 100% 100%    Last Pain:  Vitals:   01/31/17 1143  TempSrc:   PainSc: Asleep                 KEPHART,WILLIAM K

## 2017-01-31 NOTE — Discharge Instructions (Signed)
Keep the surgical bra applied today in place until Friday. You may wash under your arms and use deodorant.  On Friday, you may remove your dressing and shower. You should wear a bra day and night or support.  Tylenol/Advil/Aleve: If needed for soreness.  Norco (hydrocodone): If needed for pain. This medication may constipate.  Laxative of choice if needed.  AMBULATORY SURGERY  DISCHARGE INSTRUCTIONS   1) The drugs that you were given will stay in your system until tomorrow so for the next 24 hours you should not:  A) Drive an automobile B) Make any legal decisions C) Drink any alcoholic beverage   2) You may resume regular meals tomorrow.  Today it is better to start with liquids and gradually work up to solid foods.  You may eat anything you prefer, but it is better to start with liquids, then soup and crackers, and gradually work up to solid foods.   3) Please notify your doctor immediately if you have any unusual bleeding, trouble breathing, redness and pain at the surgery site, drainage, fever, or pain not relieved by medication.    4) Additional Instructions: TAKE A STOOL SOFTENER TWICE A DAY WHILE TAKING NARCOTIC PAIN MEDICINE TO PREVENT CONSTIPATION   Please contact your physician with any problems or Same Day Surgery at [phone], Monday through Friday 6 am to 4 pm, or [place] at [facility] number at [phone].

## 2017-01-31 NOTE — Anesthesia Preprocedure Evaluation (Signed)
Anesthesia Evaluation  Patient identified by MRN, date of birth, ID band Patient awake    Reviewed: Allergy & Precautions, NPO status , Patient's Chart, lab work & pertinent test results  History of Anesthesia Complications (+) PONV  Airway Mallampati: II       Dental   Pulmonary neg sleep apnea, neg COPD, former smoker,           Cardiovascular hypertension, Pt. on medications (-) Past MI and (-) CHF (-) dysrhythmias (-) Valvular Problems/Murmurs     Neuro/Psych neg Seizures    GI/Hepatic Neg liver ROS, GERD  Medicated and Controlled,  Endo/Other  neg diabetes  Renal/GU negative Renal ROS     Musculoskeletal   Abdominal   Peds  Hematology   Anesthesia Other Findings   Reproductive/Obstetrics                             Anesthesia Physical Anesthesia Plan  ASA: III  Anesthesia Plan: General   Post-op Pain Management:    Induction: Intravenous  PONV Risk Score and Plan: 4 or greater and Ondansetron, Dexamethasone, Midazolam, Scopolamine patch - Pre-op and Propofol infusion  Airway Management Planned: LMA  Additional Equipment:   Intra-op Plan:   Post-operative Plan:   Informed Consent: I have reviewed the patients History and Physical, chart, labs and discussed the procedure including the risks, benefits and alternatives for the proposed anesthesia with the patient or authorized representative who has indicated his/her understanding and acceptance.     Plan Discussed with:   Anesthesia Plan Comments:         Anesthesia Quick Evaluation

## 2017-01-31 NOTE — Transfer of Care (Signed)
Immediate Anesthesia Transfer of Care Note  Patient: Kristina Medina  Procedure(s) Performed: Procedure(s): BREAST LUMPECTOMY WITH SENTINEL LYMPH NODE BX (Right)  Patient Location: PACU  Anesthesia Type:General  Level of Consciousness: sedated and responds to stimulation  Airway & Oxygen Therapy: Patient Spontanous Breathing and Patient connected to face mask oxygen  Post-op Assessment: Report given to RN and Post -op Vital signs reviewed and stable  Post vital signs: Reviewed and stable  Last Vitals:  Vitals:   01/31/17 0902 01/31/17 1143  BP: (!) 167/75 (!) 113/58  Pulse: 71 65  Resp: 16 16  Temp: 36.9 C 36.4 C  SpO2: 100% 100%    Last Pain:  Vitals:   01/31/17 0902  TempSrc: Oral         Complications: No apparent anesthesia complications

## 2017-02-02 ENCOUNTER — Telehealth: Payer: Self-pay | Admitting: General Surgery

## 2017-02-02 NOTE — Telephone Encounter (Signed)
Notified path fine, no invasive cancer, nodes clear. Will discuss RT and anti-estrogen RX when receptor status results available.  Reports no pain since surgery. F/U as scheduled.

## 2017-02-06 LAB — SURGICAL PATHOLOGY

## 2017-02-07 ENCOUNTER — Ambulatory Visit (INDEPENDENT_AMBULATORY_CARE_PROVIDER_SITE_OTHER): Payer: Medicare HMO | Admitting: General Surgery

## 2017-02-07 ENCOUNTER — Encounter: Payer: Self-pay | Admitting: General Surgery

## 2017-02-07 VITALS — BP 148/78 | HR 72 | Resp 12 | Ht 63.0 in | Wt 133.0 lb

## 2017-02-07 DIAGNOSIS — D0511 Intraductal carcinoma in situ of right breast: Secondary | ICD-10-CM

## 2017-02-07 NOTE — Patient Instructions (Addendum)
The patient is aware to call back for any questions or concerns. Continue to wear bra for support The patient is aware to use a heating pad as needed for comfort.

## 2017-02-07 NOTE — Progress Notes (Signed)
Patient ID: Kristina Medina, female   DOB: [birth year], 83 y.o.   MRN: [id number]  Chief Complaint  Patient presents with  . Routine Post Op    HPI Kristina Medina is a 83 y.o. female.  Here today for postoperative visit, right lumpectomy on 01-31-17, she states she is doing well. Minimal pain.  Denies any gastrointestinal issues, bowels are moving regular. She is here with Kennon Holter, her friend.  HPI  Past Medical History:  Diagnosis Date  . Anemia   . Arthritis   . Cancer ([place]) 01/31/2017   right breast ER< 10 %; PR: Neg.Nodes negative x 4.  . Complication of anesthesia   . GERD (gastroesophageal reflux disease)   . History of kidney stones   . Hypertension   . Irregular heart beat   . Osteoporosis   . Patient is Jehovah's Witness   . PONV (postoperative nausea and vomiting)    WITH GALLBLADDER SURGERY  . S/P right breast biopsy 1966    Past Surgical History:  Procedure Laterality Date  . ANKLE SURGERY Right 2005  . BREAST BIOPSY Right 1966   neg  . BREAST BIOPSY Right 01/06/2017   APOCRINE DUCTAL CARCINOMA IN SITU (DCIS),   . BREAST EXCISIONAL BIOPSY Right 01/31/2017   right breast lumpectomy  . BREAST LUMPECTOMY WITH SENTINEL LYMPH NODE BIOPSY Right 01/31/2017   Procedure: BREAST LUMPECTOMY WITH SENTINEL LYMPH NODE BX;  Surgeon: Robert Bellow, MD;  Location: [facility] ORS;  Service: General;  Laterality: Right;  . CHOLECYSTECTOMY    . COLONOSCOPY    . ROTATOR CUFF REPAIR  2004    Family History  Problem Relation Age of Onset  . Breast cancer Sister 56  . Breast cancer Cousin   . Melanoma Brother   . Lupus Sister     Social History Social History  Substance Use Topics  . Smoking status: Former Smoker    Packs/day: 0.25    Years: 29.00    Types: Cigarettes    Quit date: 1984  . Smokeless tobacco: Never Used  . Alcohol use No    No Known Allergies  Current Outpatient Prescriptions  Medication Sig Dispense Refill  . alendronate (FOSAMAX) 70  MG tablet Take 70 mg by mouth every Friday. Take with a full glass of water on an empty stomach.     Marland Kitchen aspirin EC 81 MG tablet Take 81 mg by mouth daily.    . Calcium Carb-Cholecalciferol (CALCIUM 600+D3 PO) Take 1 tablet by mouth 2 (two) times daily.    . cetirizine (ZYRTEC) 10 MG tablet Take 10 mg by mouth daily.    Marland Kitchen diltiazem (DILACOR XR) 240 MG 24 hr capsule Take 240 mg by mouth every morning.     . ergocalciferol (VITAMIN D2) 50000 units capsule Take 50,000 Units by mouth every Friday.     . esomeprazole (NEXIUM) 40 MG capsule Take 40 mg by mouth every morning.     . folic acid (FOLVITE) 1 MG tablet Take 1 mg by mouth daily.    . hydrochlorothiazide (HYDRODIURIL) 25 MG tablet Take 25 mg by mouth daily.    . methotrexate 2.5 MG tablet Take 12.5 mg by mouth 2 (two) times a week. Takes on Thursday and Friday    . mirtazapine (REMERON) 15 MG tablet Take 15 mg by mouth at bedtime.    . Multiple Vitamin (MULTIVITAMIN WITH MINERALS) TABS tablet Take 1 tablet by mouth daily.    . potassium chloride SA (K-DUR,KLOR-CON) 20 MEQ  tablet Take 20 mEq by mouth 2 (two) times daily.      No current facility-administered medications for this visit.     Review of Systems Review of Systems  Constitutional: Negative.   Respiratory: Negative.   Cardiovascular: Negative.     Blood pressure (!) 148/78, pulse 72, resp. rate 12, height 5\' 3"  (1.6 m), weight 133 lb (60.3 kg).  Physical Exam Physical Exam  Constitutional: She is oriented to person, place, and time. She appears well-developed and well-nourished.  Pulmonary/Chest:    Incisions clean, mild swelling right breast   Neurological: She is alert and oriented to person, place, and time.  Skin: Skin is warm and dry.  Psychiatric: Her behavior is normal.    Data Reviewed 18 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  ER< 10 %; PR: Neg.  Closest margin (deep) 3 mm. This included the deep fascia. Nodes negative x 4.  Ultrasound  examination of the wide excision site in the right upper outer quadrant shows a resolving hematoma cavity with a maximum distance to the skin of 0.82 cm. Not a candidate for accelerated partial breast radiation. No images, no charge.  Assessment    Doing well status post wide excision and sentinel node biopsy.    Plan    The patient may increase her activity as tolerated.  The case will be reviewed with radiation oncology regarding the advisability of whole breast radiation considering the tumor volume and borderline receptor status.  The patient will be placed on the tumor board list for next week.  The patient will be contacted with results of these upcoming conversations.    Follow up in one month.  Resume activities as tolerated. Continue to wear bra for support. The patient is aware to use a heating pad as needed for comfort. Care has been encouraged him the use of local heat to minimize thermal injury to the surgical site. Proper heating pad positioning was discussed.      HPI, Physical Exam, Assessment and Plan have been scribed under the direction and in the presence of Robert Bellow, MD. Karie Fetch, RN  I have completed the exam and reviewed the above documentation for accuracy and completeness.  I agree with the above.  Haematologist has been used and any errors in dictation or transcription are unintentional.  Hervey Ard, M.D., F.A.C.S.  Robert Bellow 02/07/2017, 8:48 AM

## 2017-02-14 ENCOUNTER — Encounter: Payer: Self-pay | Admitting: General Surgery

## 2017-02-14 NOTE — Progress Notes (Signed)
Bone density exam dated 08/08/2011 completed by Precious Reel Junior, M.D. showed the patient and be osteoporotic with a negative T score of 3.0 for the femoral neck, -2.8 for the hip and -2.2 for the lumbar spine.

## 2017-03-08 ENCOUNTER — Ambulatory Visit (INDEPENDENT_AMBULATORY_CARE_PROVIDER_SITE_OTHER): Payer: Medicare HMO | Admitting: General Surgery

## 2017-03-08 ENCOUNTER — Encounter: Payer: Self-pay | Admitting: General Surgery

## 2017-03-08 VITALS — BP 136/78 | HR 76 | Resp 12 | Ht 63.0 in | Wt 133.0 lb

## 2017-03-08 DIAGNOSIS — N6489 Other specified disorders of breast: Secondary | ICD-10-CM

## 2017-03-08 DIAGNOSIS — D0511 Intraductal carcinoma in situ of right breast: Secondary | ICD-10-CM

## 2017-03-08 NOTE — Progress Notes (Signed)
Patient ID: CEOLA PARA, female   DOB: 17-Mar-1933, 83 y.o.   MRN: [id number]  Chief Complaint  Patient presents with  . Follow-up    HPI Kristina Medina is a 83 y.o. female here today for one month follow up right lumpectomy on 01-31-17, she states she is doing well. HPI  Past Medical History:  Diagnosis Date  . Anemia   . Arthritis   . Cancer ([place]) 01/31/2017   right breast ER< 10 %; PR: Neg.Nodes negative x 4.  . Complication of anesthesia   . GERD (gastroesophageal reflux disease)   . History of kidney stones   . Hypertension   . Irregular heart beat   . Osteoporosis   . Patient is Jehovah's Witness   . PONV (postoperative nausea and vomiting)    WITH GALLBLADDER SURGERY  . S/P right breast biopsy 1966    Past Surgical History:  Procedure Laterality Date  . ANKLE SURGERY Right 2005  . BREAST BIOPSY Right 1966   neg  . BREAST BIOPSY Right 01/06/2017   APOCRINE DUCTAL CARCINOMA IN SITU (DCIS),   . BREAST EXCISIONAL BIOPSY Right 01/31/2017   right breast lumpectomy  . BREAST LUMPECTOMY WITH SENTINEL LYMPH NODE BIOPSY Right 01/31/2017   Procedure: BREAST LUMPECTOMY WITH SENTINEL LYMPH NODE BX;  Surgeon: Kristina Bellow, MD;  Location: [facility] ORS;  Service: General;  Laterality: Right;  . CHOLECYSTECTOMY    . COLONOSCOPY    . ROTATOR CUFF REPAIR  2004    Family History  Problem Relation Age of Onset  . Breast cancer Sister 25  . Breast cancer Cousin   . Melanoma Brother   . Lupus Sister     Social History Social History  Substance Use Topics  . Smoking status: Former Smoker    Packs/day: 0.25    Years: 29.00    Types: Cigarettes    Quit date: 1984  . Smokeless tobacco: Never Used  . Alcohol use No    No Known Allergies  Current Outpatient Prescriptions  Medication Sig Dispense Refill  . alendronate (FOSAMAX) 70 MG tablet Take 70 mg by mouth every Friday. Take with a full glass of water on an empty stomach.     Marland Kitchen aspirin EC 81 MG tablet Take 81  mg by mouth daily.    . Calcium Carb-Cholecalciferol (CALCIUM 600+D3 PO) Take 1 tablet by mouth 2 (two) times daily.    . cetirizine (ZYRTEC) 10 MG tablet Take 10 mg by mouth daily.    Marland Kitchen diltiazem (DILACOR XR) 240 MG 24 hr capsule Take 240 mg by mouth every morning.     . ergocalciferol (VITAMIN D2) 50000 units capsule Take 50,000 Units by mouth every Friday.     . esomeprazole (NEXIUM) 40 MG capsule Take 40 mg by mouth every morning.     . folic acid (FOLVITE) 1 MG tablet Take 1 mg by mouth daily.    . hydrochlorothiazide (HYDRODIURIL) 25 MG tablet Take 25 mg by mouth daily.    . methotrexate 2.5 MG tablet Take 12.5 mg by mouth 2 (two) times a week. Takes on Thursday and Friday    . mirtazapine (REMERON) 15 MG tablet Take 15 mg by mouth at bedtime.    . Multiple Vitamin (MULTIVITAMIN WITH MINERALS) TABS tablet Take 1 tablet by mouth daily.    . potassium chloride SA (K-DUR,KLOR-CON) 20 MEQ tablet Take 20 mEq by mouth 2 (two) times daily.      No current facility-administered medications for this  visit.     Review of Systems Review of Systems  Constitutional: Negative.   Respiratory: Negative.   Cardiovascular: Negative.     Blood pressure 136/78, pulse 76, resp. rate 12, height 5\' 3"  (1.6 m), weight 133 lb (60.3 kg).  Physical Exam Physical Exam  Constitutional: She is oriented to person, place, and time. She appears well-developed and well-nourished.  Pulmonary/Chest:    Right breast incision is clean and healing well.   Neurological: She is alert and oriented to person, place, and time.  Skin: Skin is warm and dry.   Drained 15 ml of fluid right breast.   Data Reviewed 01/31/2017 wide excision: 18 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  ER< 10 %; PR: Neg.  Closest margin (deep) 3 mm. This included the deep fascia. Nodes negative x 4.  Assessment    Doing well status post excision of high-grade DCIS from the right breast. Mild seroma, resolved on  aspiration.    Plan    The patient has very low ER values, and the potential benefit from tamoxifen or an aromatase inhibitor may be modest best. Her case is scheduled to be reviewed at the [facility] tumor board on 03/13/2017. We'll advise the patient on this discussion at her follow-up.  We will arrange for a bone density test, as if it were low, tamoxifen/Evista therapy could be considered for both timber suppression and bone health.         Patient to return in two weeks. The patient is aware to call back for any questions or concerns.   HPI, Physical Exam, Assessment and Plan have been scribed under the direction and in the presence of Hervey Ard, MD.  Gaspar Cola, CMA  I have completed the exam and reviewed the above documentation for accuracy and completeness.  I agree with the above.  Haematologist has been used and any errors in dictation or transcription are unintentional.  Hervey Ard, M.D., F.A.C.S.  Kristina Medina 03/10/2017, 8:40 PM

## 2017-03-10 DIAGNOSIS — N6489 Other specified disorders of breast: Secondary | ICD-10-CM | POA: Insufficient documentation

## 2017-03-13 ENCOUNTER — Telehealth: Payer: Self-pay

## 2017-03-13 ENCOUNTER — Other Ambulatory Visit: Payer: Self-pay

## 2017-03-13 DIAGNOSIS — D0511 Intraductal carcinoma in situ of right breast: Secondary | ICD-10-CM

## 2017-03-13 DIAGNOSIS — Z780 Asymptomatic menopausal state: Secondary | ICD-10-CM

## 2017-03-13 NOTE — Telephone Encounter (Signed)
-----   Message from Robert Bellow, MD sent at 03/10/2017  8:40 PM EDT ----- Please arrange for the patient had a bone density test, diagnosis breast cancer. Thank you

## 2017-03-13 NOTE — Telephone Encounter (Signed)
Call to patient to see about arranging a Bone Density Test, she is amendable to this. The patient is scheduled for this on 03/16/17 at 10:20 am at the [facility]. She is aware of date, time, and instructions.

## 2017-03-16 ENCOUNTER — Ambulatory Visit
Admission: RE | Admit: 2017-03-16 | Discharge: 2017-03-16 | Disposition: A | Discharge status: Home / Self Care | Payer: Medicare HMO | Source: Ambulatory Visit | Attending: General Surgery | Admitting: General Surgery

## 2017-03-16 DIAGNOSIS — Z853 Personal history of malignant neoplasm of breast: Secondary | ICD-10-CM | POA: Diagnosis not present

## 2017-03-16 DIAGNOSIS — Z780 Asymptomatic menopausal state: Secondary | ICD-10-CM | POA: Insufficient documentation

## 2017-03-16 DIAGNOSIS — M810 Age-related osteoporosis without current pathological fracture: Secondary | ICD-10-CM | POA: Insufficient documentation

## 2017-03-16 DIAGNOSIS — Z13820 Encounter for screening for osteoporosis: Secondary | ICD-10-CM | POA: Insufficient documentation

## 2017-03-16 DIAGNOSIS — D0511 Intraductal carcinoma in situ of right breast: Secondary | ICD-10-CM

## 2017-03-22 ENCOUNTER — Encounter: Payer: Self-pay | Admitting: General Surgery

## 2017-03-22 ENCOUNTER — Ambulatory Visit (INDEPENDENT_AMBULATORY_CARE_PROVIDER_SITE_OTHER): Payer: Medicare HMO | Admitting: General Surgery

## 2017-03-22 VITALS — BP 142/68 | HR 70 | Resp 11 | Ht 63.0 in | Wt 134.0 lb

## 2017-03-22 DIAGNOSIS — D0511 Intraductal carcinoma in situ of right breast: Secondary | ICD-10-CM

## 2017-03-22 NOTE — Patient Instructions (Addendum)
The patient is aware to call back for any questions or concerns. The patient is aware to use a heating pad as needed for comfort.  Appointment with Radiation Oncologist, Dr Baruch Gouty

## 2017-03-22 NOTE — Progress Notes (Signed)
Patient ID: Kristina Medina, female   DOB: 04-25-1933, 83 y.o.   MRN: [id number]  Chief Complaint  Patient presents with  . Routine Post Op    HPI Kristina Medina is a 83 y.o. female.  Here for her postoperative visit, right breast wide excision on 01-31-17. No pain only tenderness to the area.  Bone density was 03-16-17.  HPI  Past Medical History:  Diagnosis Date  . Anemia   . Arthritis   . Cancer ([place]) 01/31/2017   right breast ER< 10 %; PR: Neg.Nodes negative x 4.  . Complication of anesthesia   . GERD (gastroesophageal reflux disease)   . History of kidney stones   . Hypertension   . Irregular heart beat   . Osteoporosis   . Patient is Jehovah's Witness   . PONV (postoperative nausea and vomiting)    WITH GALLBLADDER SURGERY  . S/P right breast biopsy 1966    Past Surgical History:  Procedure Laterality Date  . ANKLE SURGERY Right 2005  . BREAST BIOPSY Right 1966   neg  . BREAST BIOPSY Right 01/06/2017   APOCRINE DUCTAL CARCINOMA IN SITU (DCIS),   . BREAST EXCISIONAL BIOPSY Right 01/31/2017   right breast lumpectomy  . BREAST LUMPECTOMY WITH SENTINEL LYMPH NODE BIOPSY Right 01/31/2017   Procedure: BREAST LUMPECTOMY WITH SENTINEL LYMPH NODE BX;  Surgeon: Robert Bellow, MD;  Location: [facility] ORS;  Service: General;  Laterality: Right;  . CHOLECYSTECTOMY    . COLONOSCOPY    . ROTATOR CUFF REPAIR  2004    Family History  Problem Relation Age of Onset  . Breast cancer Sister 58  . Breast cancer Cousin   . Melanoma Brother   . Lupus Sister     Social History Social History  Substance Use Topics  . Smoking status: Former Smoker    Packs/day: 0.25    Years: 29.00    Types: Cigarettes    Quit date: 1984  . Smokeless tobacco: Never Used  . Alcohol use No    No Known Allergies  Current Outpatient Prescriptions  Medication Sig Dispense Refill  . alendronate (FOSAMAX) 70 MG tablet Take 70 mg by mouth every Friday. Take with a full glass of water on an  empty stomach.     Marland Kitchen aspirin EC 81 MG tablet Take 81 mg by mouth daily.    . Calcium Carb-Cholecalciferol (CALCIUM 600+D3 PO) Take 1 tablet by mouth 2 (two) times daily.    . cetirizine (ZYRTEC) 10 MG tablet Take 10 mg by mouth daily.    Marland Kitchen diltiazem (DILACOR XR) 240 MG 24 hr capsule Take 240 mg by mouth every morning.     . ergocalciferol (VITAMIN D2) 50000 units capsule Take 50,000 Units by mouth every Friday.     . esomeprazole (NEXIUM) 40 MG capsule Take 40 mg by mouth every morning.     . folic acid (FOLVITE) 1 MG tablet Take 1 mg by mouth daily.    . hydrochlorothiazide (HYDRODIURIL) 25 MG tablet Take 25 mg by mouth daily.    . methotrexate 2.5 MG tablet Take 12.5 mg by mouth 2 (two) times a week. Takes on Thursday and Friday    . mirtazapine (REMERON) 15 MG tablet Take 15 mg by mouth at bedtime.    . Multiple Vitamin (MULTIVITAMIN WITH MINERALS) TABS tablet Take 1 tablet by mouth daily.    . potassium chloride SA (K-DUR,KLOR-CON) 20 MEQ tablet Take 20 mEq by mouth 2 (two) times daily.  No current facility-administered medications for this visit.     Review of Systems Review of Systems  Constitutional: Negative.   Respiratory: Negative.   Cardiovascular: Negative.     Blood pressure (!) 142/68, pulse 70, resp. rate 11, height 5\' 3"  (1.6 m), weight 134 lb (60.8 kg).  Physical Exam Physical Exam  Constitutional: She is oriented to person, place, and time. She appears well-developed and well-nourished.  Pulmonary/Chest:  Thickening at right breast incision   Neurological: She is alert and oriented to person, place, and time.  Skin: Skin is warm and dry.  Psychiatric: Her behavior is normal.    Data Reviewed Pathology had showed an 18 mm area of high-grade DCIS associated with a papilloma.  Prior ultrasound showed inadequate spacing for accelerated partial breast radiation.  ER: <10%.  Bone density of 03/16/2017 showed osteopenia of the spine and osteoporosis of the  forearm.  Assessment    Doing well status post wide excision.    Plan    The patient is living independently and is in general excellent health. There is some question regarding the benefit of radiation therapy for DCIS after age 34. Considering the high-grade disease and the fact that she would be anticipated to obtain minimal benefit from antiestrogen therapy (except in regards to her bones) I think formal radiation therapy consultation will be appropriate.    Appointment with Radiation Oncologist, Dr Baruch Gouty. The patient is aware to use a heating pad as needed for comfort.  Follow up pending assessment by radiation oncology.  HPI, Physical Exam, Assessment and Plan have been scribed under the direction and in the presence of Robert Bellow, MD. Karie Fetch, RN  I have completed the exam and reviewed the above documentation for accuracy and completeness.  I agree with the above.  Haematologist has been used and any errors in dictation or transcription are unintentional.  Hervey Ard, M.D., F.A.C.S.   Robert Bellow 03/23/2017, 8:12 AM  Patient has been scheduled for an appointment with Dr. Noreene Filbert at the [facility] for 03-30-17 at 10 am. The patient is aware of date, time, and instructions.   Dominga Ferry, CMA

## 2017-03-30 ENCOUNTER — Ambulatory Visit
Admission: RE | Admit: 2017-03-30 | Discharge: 2017-03-30 | Disposition: A | Discharge status: Home / Self Care | Payer: Medicare HMO | Source: Ambulatory Visit | Attending: Radiation Oncology | Admitting: Radiation Oncology

## 2017-03-30 ENCOUNTER — Encounter: Payer: Self-pay | Admitting: Radiation Oncology

## 2017-03-30 VITALS — BP 153/75 | HR 78 | Temp 95.7°F | Resp 20 | Wt 133.6 lb

## 2017-03-30 DIAGNOSIS — Z87891 Personal history of nicotine dependence: Secondary | ICD-10-CM | POA: Diagnosis not present

## 2017-03-30 DIAGNOSIS — Z79899 Other long term (current) drug therapy: Secondary | ICD-10-CM | POA: Diagnosis not present

## 2017-03-30 DIAGNOSIS — Z170 Estrogen receptor positive status [ER+]: Secondary | ICD-10-CM | POA: Diagnosis not present

## 2017-03-30 DIAGNOSIS — Z7982 Long term (current) use of aspirin: Secondary | ICD-10-CM | POA: Diagnosis not present

## 2017-03-30 DIAGNOSIS — D0511 Intraductal carcinoma in situ of right breast: Secondary | ICD-10-CM

## 2017-03-30 DIAGNOSIS — Z808 Family history of malignant neoplasm of other organs or systems: Secondary | ICD-10-CM | POA: Diagnosis not present

## 2017-03-30 DIAGNOSIS — Z803 Family history of malignant neoplasm of breast: Secondary | ICD-10-CM | POA: Insufficient documentation

## 2017-03-30 DIAGNOSIS — M129 Arthropathy, unspecified: Secondary | ICD-10-CM | POA: Insufficient documentation

## 2017-03-30 DIAGNOSIS — Z510 Encounter for antineoplastic radiation therapy: Secondary | ICD-10-CM | POA: Insufficient documentation

## 2017-03-30 DIAGNOSIS — Z9049 Acquired absence of other specified parts of digestive tract: Secondary | ICD-10-CM | POA: Insufficient documentation

## 2017-03-30 DIAGNOSIS — K219 Gastro-esophageal reflux disease without esophagitis: Secondary | ICD-10-CM | POA: Insufficient documentation

## 2017-03-30 NOTE — Consult Note (Signed)
NEW PATIENT EVALUATION  Name: Kristina Medina  MRN: [id number]  Date:   03/30/2017     DOB: 1932/12/04   This 83 y.o. female patient presents to the clinic for initial evaluation of stage 0 (Tis N0 M0) ductal carcinoma in situ borderline ER positive status post wide local excision of the right breast in 83 year old female.  REFERRING PHYSICIAN: Tracie Harrier, MD  CHIEF COMPLAINT:  Chief Complaint  Patient presents with  . Breast Cancer    Pt is here for initial consultation of breast cancer.      DIAGNOSIS: The encounter diagnosis was Ductal carcinoma in situ (DCIS) of right breast.   PREVIOUS INVESTIGATIONS:  Mammogram and ultrasound reviewed Clinical notes reviewed Pathology report reviewed  HPI: Patient is a pleasant 83 year old female who presented with an abnormal mammogram of her right breast showing an area of coarse heterogeneous calcifications in the upper outer right breast measuring approximately 2 cm in greatest dimension.  This was confirmed on ultrasound leading to a targeted ultrasound of the right breast.  This was positive for high-grade ductal carcinoma in situ with borderline ER positivity.  Patient went on to have a wide local excision for a high-grade apocrine ductal carcinoma in situ measuring 1.8 cm in greatest dimension with margins clear at 3 mm.  Comedonecrosis was present.  4 sentinel lymph nodes were biopsied all negative for metastatic disease.  Patient tolerated her surgery well.  Based on the high-grade nature of her disease she is referred to radiation oncology for opinion.  She is doing well has very little tenderness of the breast.  PLANNED TREATMENT REGIMEN: Hypofractionated whole breast radiation  PAST MEDICAL HISTORY:  has a past medical history of Anemia; Arthritis; Cancer ([place]) (01/31/2017); Complication of anesthesia; GERD (gastroesophageal reflux disease); History of kidney stones; Hypertension; Irregular heart beat; Osteoporosis; Patient  is Jehovah's Witness; PONV (postoperative nausea and vomiting); and S/P right breast biopsy (1966).    PAST SURGICAL HISTORY:  Past Surgical History:  Procedure Laterality Date  . ANKLE SURGERY Right 2005  . BREAST BIOPSY Right 1966   neg  . BREAST BIOPSY Right 01/06/2017   APOCRINE DUCTAL CARCINOMA IN SITU (DCIS),   . BREAST EXCISIONAL BIOPSY Right 01/31/2017   right breast lumpectomy  . BREAST LUMPECTOMY WITH SENTINEL LYMPH NODE BIOPSY Right 01/31/2017   Procedure: BREAST LUMPECTOMY WITH SENTINEL LYMPH NODE BX;  Surgeon: Robert Bellow, MD;  Location: [facility] ORS;  Service: General;  Laterality: Right;  . CHOLECYSTECTOMY    . COLONOSCOPY    . ROTATOR CUFF REPAIR  2004    FAMILY HISTORY: family history includes Breast cancer in her cousin; Breast cancer (age of onset: 30) in her sister; Lupus in her sister; Melanoma in her brother.  SOCIAL HISTORY:  reports that she quit smoking about 34 years ago. Her smoking use included Cigarettes. She has a 7.25 pack-year smoking history. She has never used smokeless tobacco. She reports that she does not drink alcohol or use drugs.  ALLERGIES: Patient has no known allergies.  MEDICATIONS:  Current Outpatient Prescriptions  Medication Sig Dispense Refill  . alendronate (FOSAMAX) 70 MG tablet Take 70 mg by mouth every Friday. Take with a full glass of water on an empty stomach.     Marland Kitchen aspirin EC 81 MG tablet Take 81 mg by mouth daily.    . Calcium Carb-Cholecalciferol (CALCIUM 600+D3 PO) Take 1 tablet by mouth 2 (two) times daily.    . cetirizine (ZYRTEC) 10 MG tablet Take 10  mg by mouth daily.    Marland Kitchen diltiazem (DILACOR XR) 240 MG 24 hr capsule Take 240 mg by mouth every morning.     . ergocalciferol (VITAMIN D2) 50000 units capsule Take 50,000 Units by mouth every Friday.     . esomeprazole (NEXIUM) 40 MG capsule Take 40 mg by mouth every morning.     . folic acid (FOLVITE) 1 MG tablet Take 1 mg by mouth daily.    . hydrochlorothiazide  (HYDRODIURIL) 25 MG tablet Take 25 mg by mouth daily.    . methotrexate 2.5 MG tablet Take 12.5 mg by mouth 2 (two) times a week. Takes on Thursday and Friday    . mirtazapine (REMERON) 15 MG tablet Take 15 mg by mouth at bedtime.    . Multiple Vitamin (MULTIVITAMIN WITH MINERALS) TABS tablet Take 1 tablet by mouth daily.    . potassium chloride SA (K-DUR,KLOR-CON) 20 MEQ tablet Take 20 mEq by mouth 2 (two) times daily.      No current facility-administered medications for this encounter.     ECOG PERFORMANCE STATUS:  0 - Asymptomatic  REVIEW OF SYSTEMS: Patient does have arthritis. Patient denies any weight loss, fatigue, weakness, fever, chills or night sweats. Patient denies any loss of vision, blurred vision. Patient denies any ringing  of the ears or hearing loss. No irregular heartbeat. Patient denies heart murmur or history of fainting. Patient denies any chest pain or pain radiating to her upper extremities. Patient denies any shortness of breath, difficulty breathing at night, cough or hemoptysis. Patient denies any swelling in the lower legs. Patient denies any nausea vomiting, vomiting of blood, or coffee ground material in the vomitus. Patient denies any stomach pain. Patient states has had normal bowel movements no significant constipation or diarrhea. Patient denies any dysuria, hematuria or significant nocturia. Patient denies any problems walking, swelling in the joints or loss of balance. Patient denies any skin changes, loss of hair or loss of weight. Patient denies any excessive worrying or anxiety or significant depression. Patient denies any problems with insomnia. Patient denies excessive thirst, polyuria, polydipsia. Patient denies any swollen glands, patient denies easy bruising or easy bleeding. Patient denies any recent infections, allergies or URI. Patient "s visual fields have not changed significantly in recent time.   PHYSICAL EXAM: BP (!) 153/75   Pulse 78   Temp (!)  95.7 F (35.4 C)   Resp 20   Wt 133 lb 9.6 oz (60.6 kg)   BMI 23.67 kg/m  Right breast has a well-healed incision scar with some asymmetry of the breast towards the scar site.  No dominant mass or nodularity is noted in either breast into positions examined except for some firmness in the seroma cavity.  No axillary or supraclavicular adenopathy is identified. Well-developed well-nourished patient in NAD. HEENT reveals PERLA, EOMI, discs not visualized.  Oral cavity is clear. No oral mucosal lesions are identified. Neck is clear without evidence of cervical or supraclavicular adenopathy. Lungs are clear to A&P. Cardiac examination is essentially unremarkable with regular rate and rhythm without murmur rub or thrill. Abdomen is benign with no organomegaly or masses noted. Motor sensory and DTR levels are equal and symmetric in the upper and lower extremities. Cranial nerves II through XII are grossly intact. Proprioception is intact. No peripheral adenopathy or edema is identified. No motor or sensory levels are noted. Crude visual fields are within normal range.  LABORATORY DATA: Pathology report reviewed    RADIOLOGY RESULTS: Mammograms and ultrasound  reviewed   IMPRESSION: Stage 0 ductal carcinoma in situ of the right breast an 83 year old female status post wide local excision and sentinel node biopsy  PLAN: At this time I gone over treatment recommendations based on her high-grade ductal carcinoma in situ.  I went over risks and benefits of treatment including skin reaction fatigue alteration of blood counts possible occlusion of superficial lung.  I would plan a 4-week hypofractionated course of treatment.  I would boost her scar another 1000 cGy in 5 fractions using electron beam.  Patient seems to comprehend my treatment plan well.  She is borderline ER positive may not benefit from antiestrogen therapy after completion of radiation.  Patient will discuss with the family my treatment  recommendations.  I have tentatively set up and ordered CT simulation for next week.  Based on her seroma ultrasound she is not a candidate for accelerated partial breast radiation.  I would like to take this opportunity to thank you for allowing me to participate in the care of your patient.Armstead Peaks., MD

## 2017-04-03 ENCOUNTER — Encounter: Payer: Self-pay | Admitting: General Surgery

## 2017-04-03 ENCOUNTER — Ambulatory Visit (INDEPENDENT_AMBULATORY_CARE_PROVIDER_SITE_OTHER): Payer: Medicare HMO | Admitting: General Surgery

## 2017-04-03 VITALS — BP 131/60 | HR 70 | Resp 12 | Ht 63.0 in | Wt 133.0 lb

## 2017-04-03 DIAGNOSIS — D0511 Intraductal carcinoma in situ of right breast: Secondary | ICD-10-CM

## 2017-04-03 NOTE — Patient Instructions (Signed)
Patient to return in two months. The patient is aware to call back for any questions or concerns. 

## 2017-04-03 NOTE — Progress Notes (Signed)
Patient ID: Kristina Medina, female   DOB: [birth year], 83 y.o.   MRN: [id number]  Chief Complaint  Patient presents with  . Routine Post Op    HPI Kristina Medina is a 83 y.o. female here today for her follow up right breast wide excision done 01/31/2017. Patient states she is doing well.  HPI  Past Medical History:  Diagnosis Date  . Anemia   . Arthritis   . Cancer ([place]) 01/31/2017   right breast ER< 10 %; PR: Neg.Nodes negative x 4.  . Complication of anesthesia   . GERD (gastroesophageal reflux disease)   . History of kidney stones   . Hypertension   . Irregular heart beat   . Osteoporosis   . Patient is Jehovah's Witness   . PONV (postoperative nausea and vomiting)    WITH GALLBLADDER SURGERY  . S/P right breast biopsy 1966    Past Surgical History:  Procedure Laterality Date  . ANKLE SURGERY Right 2005  . BREAST BIOPSY Right 1966   neg  . BREAST BIOPSY Right 01/06/2017   APOCRINE DUCTAL CARCINOMA IN SITU (DCIS),   . BREAST EXCISIONAL BIOPSY Right 01/31/2017   right breast lumpectomy  . BREAST LUMPECTOMY WITH SENTINEL LYMPH NODE BIOPSY Right 01/31/2017   Procedure: BREAST LUMPECTOMY WITH SENTINEL LYMPH NODE BX;  Surgeon: Robert Bellow, MD;  Location: [facility] ORS;  Service: General;  Laterality: Right;  . CHOLECYSTECTOMY    . COLONOSCOPY    . ROTATOR CUFF REPAIR  2004    Family History  Problem Relation Age of Onset  . Breast cancer Sister 70  . Breast cancer Cousin   . Melanoma Brother   . Lupus Sister     Social History Social History  Substance Use Topics  . Smoking status: Former Smoker    Packs/day: 0.25    Years: 29.00    Types: Cigarettes    Quit date: 1984  . Smokeless tobacco: Never Used  . Alcohol use No    No Known Allergies  Current Outpatient Prescriptions  Medication Sig Dispense Refill  . alendronate (FOSAMAX) 70 MG tablet Take 70 mg by mouth every Friday. Take with a full glass of water on an empty stomach.     Marland Kitchen aspirin EC 81  MG tablet Take 81 mg by mouth daily.    . Calcium Carb-Cholecalciferol (CALCIUM 600+D3 PO) Take 1 tablet by mouth 2 (two) times daily.    . cetirizine (ZYRTEC) 10 MG tablet Take 10 mg by mouth daily.    Marland Kitchen diltiazem (DILACOR XR) 240 MG 24 hr capsule Take 240 mg by mouth every morning.     . ergocalciferol (VITAMIN D2) 50000 units capsule Take 50,000 Units by mouth every Friday.     . esomeprazole (NEXIUM) 40 MG capsule Take 40 mg by mouth every morning.     . folic acid (FOLVITE) 1 MG tablet Take 1 mg by mouth daily.    . hydrochlorothiazide (HYDRODIURIL) 25 MG tablet Take 25 mg by mouth daily.    . methotrexate 2.5 MG tablet Take 12.5 mg by mouth 2 (two) times a week. Takes on Thursday and Friday    . mirtazapine (REMERON) 15 MG tablet Take 15 mg by mouth at bedtime.    . Multiple Vitamin (MULTIVITAMIN WITH MINERALS) TABS tablet Take 1 tablet by mouth daily.    . potassium chloride SA (K-DUR,KLOR-CON) 20 MEQ tablet Take 20 mEq by mouth 2 (two) times daily.      No current  facility-administered medications for this visit.     Review of Systems Review of Systems  Respiratory: Negative.   Cardiovascular: Negative.     Blood pressure 131/60, pulse 70, resp. rate 12, height 5\' 3"  (1.6 m), weight 133 lb (60.3 kg).  Physical Exam Physical Exam  Constitutional: She is oriented to person, place, and time. She appears well-developed and well-nourished.  Pulmonary/Chest:    Neurological: She is alert and oriented to person, place, and time.  Skin: Skin is warm.    Data Reviewed Radiation oncology notes of 03/30/2017 reviewed.  Assessment    High-grade DCIS. ER: Less than 10%.  Osteoporosis on bone density testing.    Plan    We'll plan for a follow-up visit after she completes her upcoming radiation therapy.  We'll likely consider the use of tamoxifen although the benefits are modest in light of the low ER status. This may help with her bone density.    Patient to return in two  months. The patient is aware to call back for any questions or concerns.  HPI, Physical Exam, Assessment and Plan have been scribed under the direction and in the presence of Hervey Ard, MD.  Gaspar Cola, CMA  I have completed the exam and reviewed the above documentation for accuracy and completeness.  I agree with the above.  Haematologist has been used and any errors in dictation or transcription are unintentional.  Hervey Ard, M.D., F.A.C.S.  Robert Bellow 04/03/2017, 10:11 PM

## 2017-04-05 ENCOUNTER — Ambulatory Visit
Admission: RE | Admit: 2017-04-05 | Discharge: 2017-04-05 | Disposition: A | Discharge status: Home / Self Care | Payer: Medicare HMO | Source: Ambulatory Visit | Attending: Radiation Oncology | Admitting: Radiation Oncology

## 2017-04-05 DIAGNOSIS — Z79899 Other long term (current) drug therapy: Secondary | ICD-10-CM | POA: Diagnosis not present

## 2017-04-05 DIAGNOSIS — Z510 Encounter for antineoplastic radiation therapy: Secondary | ICD-10-CM | POA: Diagnosis not present

## 2017-04-05 DIAGNOSIS — Z7982 Long term (current) use of aspirin: Secondary | ICD-10-CM | POA: Diagnosis not present

## 2017-04-05 DIAGNOSIS — D0511 Intraductal carcinoma in situ of right breast: Secondary | ICD-10-CM | POA: Diagnosis not present

## 2017-04-05 DIAGNOSIS — Z9049 Acquired absence of other specified parts of digestive tract: Secondary | ICD-10-CM | POA: Diagnosis not present

## 2017-04-05 DIAGNOSIS — Z87891 Personal history of nicotine dependence: Secondary | ICD-10-CM | POA: Diagnosis not present

## 2017-04-05 DIAGNOSIS — Z170 Estrogen receptor positive status [ER+]: Secondary | ICD-10-CM | POA: Diagnosis not present

## 2017-04-05 DIAGNOSIS — K219 Gastro-esophageal reflux disease without esophagitis: Secondary | ICD-10-CM | POA: Diagnosis not present

## 2017-04-05 DIAGNOSIS — M129 Arthropathy, unspecified: Secondary | ICD-10-CM | POA: Diagnosis not present

## 2017-04-07 ENCOUNTER — Other Ambulatory Visit: Payer: Self-pay | Admitting: *Deleted

## 2017-04-07 DIAGNOSIS — D0511 Intraductal carcinoma in situ of right breast: Secondary | ICD-10-CM

## 2017-04-10 DIAGNOSIS — Z87891 Personal history of nicotine dependence: Secondary | ICD-10-CM | POA: Diagnosis not present

## 2017-04-10 DIAGNOSIS — Z7982 Long term (current) use of aspirin: Secondary | ICD-10-CM | POA: Diagnosis not present

## 2017-04-10 DIAGNOSIS — D0511 Intraductal carcinoma in situ of right breast: Secondary | ICD-10-CM | POA: Diagnosis not present

## 2017-04-10 DIAGNOSIS — K219 Gastro-esophageal reflux disease without esophagitis: Secondary | ICD-10-CM | POA: Diagnosis not present

## 2017-04-10 DIAGNOSIS — Z9049 Acquired absence of other specified parts of digestive tract: Secondary | ICD-10-CM | POA: Diagnosis not present

## 2017-04-10 DIAGNOSIS — M129 Arthropathy, unspecified: Secondary | ICD-10-CM | POA: Diagnosis not present

## 2017-04-10 DIAGNOSIS — Z170 Estrogen receptor positive status [ER+]: Secondary | ICD-10-CM | POA: Diagnosis not present

## 2017-04-10 DIAGNOSIS — Z79899 Other long term (current) drug therapy: Secondary | ICD-10-CM | POA: Diagnosis not present

## 2017-04-10 DIAGNOSIS — Z510 Encounter for antineoplastic radiation therapy: Secondary | ICD-10-CM | POA: Diagnosis not present

## 2017-04-12 ENCOUNTER — Ambulatory Visit
Admission: RE | Admit: 2017-04-12 | Discharge: 2017-04-12 | Disposition: A | Discharge status: Home / Self Care | Payer: Medicare HMO | Source: Ambulatory Visit | Attending: Radiation Oncology | Admitting: Radiation Oncology

## 2017-04-12 DIAGNOSIS — M129 Arthropathy, unspecified: Secondary | ICD-10-CM | POA: Diagnosis not present

## 2017-04-12 DIAGNOSIS — K219 Gastro-esophageal reflux disease without esophagitis: Secondary | ICD-10-CM | POA: Diagnosis not present

## 2017-04-12 DIAGNOSIS — Z9049 Acquired absence of other specified parts of digestive tract: Secondary | ICD-10-CM | POA: Diagnosis not present

## 2017-04-12 DIAGNOSIS — Z87891 Personal history of nicotine dependence: Secondary | ICD-10-CM | POA: Diagnosis not present

## 2017-04-12 DIAGNOSIS — Z79899 Other long term (current) drug therapy: Secondary | ICD-10-CM | POA: Diagnosis not present

## 2017-04-12 DIAGNOSIS — Z7982 Long term (current) use of aspirin: Secondary | ICD-10-CM | POA: Diagnosis not present

## 2017-04-12 DIAGNOSIS — Z510 Encounter for antineoplastic radiation therapy: Secondary | ICD-10-CM | POA: Diagnosis not present

## 2017-04-12 DIAGNOSIS — Z170 Estrogen receptor positive status [ER+]: Secondary | ICD-10-CM | POA: Diagnosis not present

## 2017-04-12 DIAGNOSIS — D0511 Intraductal carcinoma in situ of right breast: Secondary | ICD-10-CM | POA: Diagnosis not present

## 2017-04-13 ENCOUNTER — Ambulatory Visit
Admission: RE | Admit: 2017-04-13 | Discharge: 2017-04-13 | Disposition: A | Discharge status: Home / Self Care | Payer: Medicare HMO | Source: Ambulatory Visit | Attending: Radiation Oncology | Admitting: Radiation Oncology

## 2017-04-13 DIAGNOSIS — D0511 Intraductal carcinoma in situ of right breast: Secondary | ICD-10-CM | POA: Diagnosis not present

## 2017-04-13 DIAGNOSIS — Z170 Estrogen receptor positive status [ER+]: Secondary | ICD-10-CM | POA: Diagnosis not present

## 2017-04-13 DIAGNOSIS — Z510 Encounter for antineoplastic radiation therapy: Secondary | ICD-10-CM | POA: Diagnosis not present

## 2017-04-13 DIAGNOSIS — Z79899 Other long term (current) drug therapy: Secondary | ICD-10-CM | POA: Diagnosis not present

## 2017-04-13 DIAGNOSIS — Z9049 Acquired absence of other specified parts of digestive tract: Secondary | ICD-10-CM | POA: Diagnosis not present

## 2017-04-13 DIAGNOSIS — M129 Arthropathy, unspecified: Secondary | ICD-10-CM | POA: Diagnosis not present

## 2017-04-13 DIAGNOSIS — K219 Gastro-esophageal reflux disease without esophagitis: Secondary | ICD-10-CM | POA: Diagnosis not present

## 2017-04-13 DIAGNOSIS — Z87891 Personal history of nicotine dependence: Secondary | ICD-10-CM | POA: Diagnosis not present

## 2017-04-13 DIAGNOSIS — Z7982 Long term (current) use of aspirin: Secondary | ICD-10-CM | POA: Diagnosis not present

## 2017-04-14 ENCOUNTER — Ambulatory Visit
Admission: RE | Admit: 2017-04-14 | Discharge: 2017-04-14 | Disposition: A | Discharge status: Home / Self Care | Payer: Medicare HMO | Source: Ambulatory Visit | Attending: Radiation Oncology | Admitting: Radiation Oncology

## 2017-04-14 DIAGNOSIS — M129 Arthropathy, unspecified: Secondary | ICD-10-CM | POA: Diagnosis not present

## 2017-04-14 DIAGNOSIS — D0511 Intraductal carcinoma in situ of right breast: Secondary | ICD-10-CM | POA: Diagnosis not present

## 2017-04-14 DIAGNOSIS — Z87891 Personal history of nicotine dependence: Secondary | ICD-10-CM | POA: Diagnosis not present

## 2017-04-14 DIAGNOSIS — Z7982 Long term (current) use of aspirin: Secondary | ICD-10-CM | POA: Diagnosis not present

## 2017-04-14 DIAGNOSIS — Z170 Estrogen receptor positive status [ER+]: Secondary | ICD-10-CM | POA: Diagnosis not present

## 2017-04-14 DIAGNOSIS — Z9049 Acquired absence of other specified parts of digestive tract: Secondary | ICD-10-CM | POA: Diagnosis not present

## 2017-04-14 DIAGNOSIS — Z79899 Other long term (current) drug therapy: Secondary | ICD-10-CM | POA: Diagnosis not present

## 2017-04-14 DIAGNOSIS — Z510 Encounter for antineoplastic radiation therapy: Secondary | ICD-10-CM | POA: Diagnosis not present

## 2017-04-14 DIAGNOSIS — K219 Gastro-esophageal reflux disease without esophagitis: Secondary | ICD-10-CM | POA: Diagnosis not present

## 2017-04-17 ENCOUNTER — Ambulatory Visit
Admission: RE | Admit: 2017-04-17 | Discharge: 2017-04-17 | Disposition: A | Discharge status: Home / Self Care | Payer: Medicare HMO | Source: Ambulatory Visit | Attending: Radiation Oncology | Admitting: Radiation Oncology

## 2017-04-17 DIAGNOSIS — Z510 Encounter for antineoplastic radiation therapy: Secondary | ICD-10-CM | POA: Diagnosis not present

## 2017-04-17 DIAGNOSIS — Z9049 Acquired absence of other specified parts of digestive tract: Secondary | ICD-10-CM | POA: Diagnosis not present

## 2017-04-17 DIAGNOSIS — K219 Gastro-esophageal reflux disease without esophagitis: Secondary | ICD-10-CM | POA: Diagnosis not present

## 2017-04-17 DIAGNOSIS — Z170 Estrogen receptor positive status [ER+]: Secondary | ICD-10-CM | POA: Diagnosis not present

## 2017-04-17 DIAGNOSIS — D0511 Intraductal carcinoma in situ of right breast: Secondary | ICD-10-CM | POA: Diagnosis not present

## 2017-04-17 DIAGNOSIS — Z7982 Long term (current) use of aspirin: Secondary | ICD-10-CM | POA: Diagnosis not present

## 2017-04-17 DIAGNOSIS — Z79899 Other long term (current) drug therapy: Secondary | ICD-10-CM | POA: Diagnosis not present

## 2017-04-17 DIAGNOSIS — M129 Arthropathy, unspecified: Secondary | ICD-10-CM | POA: Diagnosis not present

## 2017-04-17 DIAGNOSIS — Z87891 Personal history of nicotine dependence: Secondary | ICD-10-CM | POA: Diagnosis not present

## 2017-04-18 ENCOUNTER — Ambulatory Visit
Admission: RE | Admit: 2017-04-18 | Discharge: 2017-04-18 | Disposition: A | Discharge status: Home / Self Care | Payer: Medicare HMO | Source: Ambulatory Visit | Attending: Radiation Oncology | Admitting: Radiation Oncology

## 2017-04-18 DIAGNOSIS — Z7982 Long term (current) use of aspirin: Secondary | ICD-10-CM | POA: Diagnosis not present

## 2017-04-18 DIAGNOSIS — Z79899 Other long term (current) drug therapy: Secondary | ICD-10-CM | POA: Diagnosis not present

## 2017-04-18 DIAGNOSIS — Z170 Estrogen receptor positive status [ER+]: Secondary | ICD-10-CM | POA: Diagnosis not present

## 2017-04-18 DIAGNOSIS — Z0000 Encounter for general adult medical examination without abnormal findings: Secondary | ICD-10-CM | POA: Diagnosis not present

## 2017-04-18 DIAGNOSIS — I10 Essential (primary) hypertension: Secondary | ICD-10-CM | POA: Diagnosis not present

## 2017-04-18 DIAGNOSIS — M05751 Rheumatoid arthritis with rheumatoid factor of right hip without organ or systems involvement: Secondary | ICD-10-CM | POA: Diagnosis not present

## 2017-04-18 DIAGNOSIS — Z87891 Personal history of nicotine dependence: Secondary | ICD-10-CM | POA: Diagnosis not present

## 2017-04-18 DIAGNOSIS — K219 Gastro-esophageal reflux disease without esophagitis: Secondary | ICD-10-CM | POA: Diagnosis not present

## 2017-04-18 DIAGNOSIS — D0511 Intraductal carcinoma in situ of right breast: Secondary | ICD-10-CM | POA: Diagnosis not present

## 2017-04-18 DIAGNOSIS — N6311 Unspecified lump in the right breast, upper outer quadrant: Secondary | ICD-10-CM | POA: Diagnosis not present

## 2017-04-18 DIAGNOSIS — Z510 Encounter for antineoplastic radiation therapy: Secondary | ICD-10-CM | POA: Diagnosis not present

## 2017-04-18 DIAGNOSIS — M129 Arthropathy, unspecified: Secondary | ICD-10-CM | POA: Diagnosis not present

## 2017-04-18 DIAGNOSIS — G4700 Insomnia, unspecified: Secondary | ICD-10-CM | POA: Diagnosis not present

## 2017-04-18 DIAGNOSIS — Z9049 Acquired absence of other specified parts of digestive tract: Secondary | ICD-10-CM | POA: Diagnosis not present

## 2017-04-19 ENCOUNTER — Ambulatory Visit
Admission: RE | Admit: 2017-04-19 | Discharge: 2017-04-19 | Disposition: A | Discharge status: Home / Self Care | Payer: Medicare HMO | Source: Ambulatory Visit | Attending: Radiation Oncology | Admitting: Radiation Oncology

## 2017-04-19 DIAGNOSIS — Z170 Estrogen receptor positive status [ER+]: Secondary | ICD-10-CM | POA: Diagnosis not present

## 2017-04-19 DIAGNOSIS — Z9049 Acquired absence of other specified parts of digestive tract: Secondary | ICD-10-CM | POA: Diagnosis not present

## 2017-04-19 DIAGNOSIS — Z510 Encounter for antineoplastic radiation therapy: Secondary | ICD-10-CM | POA: Diagnosis not present

## 2017-04-19 DIAGNOSIS — D0511 Intraductal carcinoma in situ of right breast: Secondary | ICD-10-CM | POA: Diagnosis not present

## 2017-04-19 DIAGNOSIS — Z79899 Other long term (current) drug therapy: Secondary | ICD-10-CM | POA: Diagnosis not present

## 2017-04-19 DIAGNOSIS — M129 Arthropathy, unspecified: Secondary | ICD-10-CM | POA: Diagnosis not present

## 2017-04-19 DIAGNOSIS — Z87891 Personal history of nicotine dependence: Secondary | ICD-10-CM | POA: Diagnosis not present

## 2017-04-19 DIAGNOSIS — Z7982 Long term (current) use of aspirin: Secondary | ICD-10-CM | POA: Diagnosis not present

## 2017-04-19 DIAGNOSIS — K219 Gastro-esophageal reflux disease without esophagitis: Secondary | ICD-10-CM | POA: Diagnosis not present

## 2017-04-20 ENCOUNTER — Ambulatory Visit
Admission: RE | Admit: 2017-04-20 | Discharge: 2017-04-20 | Disposition: A | Discharge status: Home / Self Care | Payer: Medicare HMO | Source: Ambulatory Visit | Attending: Radiation Oncology | Admitting: Radiation Oncology

## 2017-04-20 DIAGNOSIS — Z170 Estrogen receptor positive status [ER+]: Secondary | ICD-10-CM | POA: Diagnosis not present

## 2017-04-20 DIAGNOSIS — M0579 Rheumatoid arthritis with rheumatoid factor of multiple sites without organ or systems involvement: Secondary | ICD-10-CM | POA: Diagnosis not present

## 2017-04-20 DIAGNOSIS — M810 Age-related osteoporosis without current pathological fracture: Secondary | ICD-10-CM | POA: Diagnosis not present

## 2017-04-20 DIAGNOSIS — Z7982 Long term (current) use of aspirin: Secondary | ICD-10-CM | POA: Diagnosis not present

## 2017-04-20 DIAGNOSIS — Z79899 Other long term (current) drug therapy: Secondary | ICD-10-CM | POA: Diagnosis not present

## 2017-04-20 DIAGNOSIS — D0511 Intraductal carcinoma in situ of right breast: Secondary | ICD-10-CM | POA: Diagnosis not present

## 2017-04-20 DIAGNOSIS — Z9049 Acquired absence of other specified parts of digestive tract: Secondary | ICD-10-CM | POA: Diagnosis not present

## 2017-04-20 DIAGNOSIS — K219 Gastro-esophageal reflux disease without esophagitis: Secondary | ICD-10-CM | POA: Diagnosis not present

## 2017-04-20 DIAGNOSIS — Z87891 Personal history of nicotine dependence: Secondary | ICD-10-CM | POA: Diagnosis not present

## 2017-04-20 DIAGNOSIS — M129 Arthropathy, unspecified: Secondary | ICD-10-CM | POA: Diagnosis not present

## 2017-04-20 DIAGNOSIS — Z510 Encounter for antineoplastic radiation therapy: Secondary | ICD-10-CM | POA: Diagnosis not present

## 2017-04-21 ENCOUNTER — Ambulatory Visit
Admission: RE | Admit: 2017-04-21 | Discharge: 2017-04-21 | Disposition: A | Discharge status: Home / Self Care | Payer: Medicare HMO | Source: Ambulatory Visit | Attending: Radiation Oncology | Admitting: Radiation Oncology

## 2017-04-21 DIAGNOSIS — Z510 Encounter for antineoplastic radiation therapy: Secondary | ICD-10-CM | POA: Diagnosis not present

## 2017-04-21 DIAGNOSIS — K219 Gastro-esophageal reflux disease without esophagitis: Secondary | ICD-10-CM | POA: Diagnosis not present

## 2017-04-21 DIAGNOSIS — M129 Arthropathy, unspecified: Secondary | ICD-10-CM | POA: Diagnosis not present

## 2017-04-21 DIAGNOSIS — Z170 Estrogen receptor positive status [ER+]: Secondary | ICD-10-CM | POA: Diagnosis not present

## 2017-04-21 DIAGNOSIS — Z9049 Acquired absence of other specified parts of digestive tract: Secondary | ICD-10-CM | POA: Diagnosis not present

## 2017-04-21 DIAGNOSIS — Z7982 Long term (current) use of aspirin: Secondary | ICD-10-CM | POA: Diagnosis not present

## 2017-04-21 DIAGNOSIS — D0511 Intraductal carcinoma in situ of right breast: Secondary | ICD-10-CM | POA: Diagnosis not present

## 2017-04-21 DIAGNOSIS — Z87891 Personal history of nicotine dependence: Secondary | ICD-10-CM | POA: Diagnosis not present

## 2017-04-21 DIAGNOSIS — Z79899 Other long term (current) drug therapy: Secondary | ICD-10-CM | POA: Diagnosis not present

## 2017-04-24 ENCOUNTER — Ambulatory Visit
Admission: RE | Admit: 2017-04-24 | Discharge: 2017-04-24 | Disposition: A | Discharge status: Home / Self Care | Payer: Medicare HMO | Source: Ambulatory Visit | Attending: Radiation Oncology | Admitting: Radiation Oncology

## 2017-04-24 ENCOUNTER — Ambulatory Visit: Payer: Medicare HMO

## 2017-04-24 DIAGNOSIS — M129 Arthropathy, unspecified: Secondary | ICD-10-CM | POA: Diagnosis not present

## 2017-04-24 DIAGNOSIS — Z7982 Long term (current) use of aspirin: Secondary | ICD-10-CM | POA: Diagnosis not present

## 2017-04-24 DIAGNOSIS — Z170 Estrogen receptor positive status [ER+]: Secondary | ICD-10-CM | POA: Diagnosis not present

## 2017-04-24 DIAGNOSIS — D0511 Intraductal carcinoma in situ of right breast: Secondary | ICD-10-CM | POA: Diagnosis not present

## 2017-04-24 DIAGNOSIS — Z510 Encounter for antineoplastic radiation therapy: Secondary | ICD-10-CM | POA: Diagnosis not present

## 2017-04-24 DIAGNOSIS — Z87891 Personal history of nicotine dependence: Secondary | ICD-10-CM | POA: Diagnosis not present

## 2017-04-24 DIAGNOSIS — Z79899 Other long term (current) drug therapy: Secondary | ICD-10-CM | POA: Diagnosis not present

## 2017-04-24 DIAGNOSIS — Z9049 Acquired absence of other specified parts of digestive tract: Secondary | ICD-10-CM | POA: Diagnosis not present

## 2017-04-24 DIAGNOSIS — K219 Gastro-esophageal reflux disease without esophagitis: Secondary | ICD-10-CM | POA: Diagnosis not present

## 2017-04-25 ENCOUNTER — Inpatient Hospital Stay: Payer: Medicare HMO | Attending: Radiation Oncology

## 2017-04-25 ENCOUNTER — Ambulatory Visit
Admission: RE | Admit: 2017-04-25 | Discharge: 2017-04-25 | Disposition: A | Discharge status: Home / Self Care | Payer: Medicare HMO | Source: Ambulatory Visit | Attending: Radiation Oncology | Admitting: Radiation Oncology

## 2017-04-25 DIAGNOSIS — Z7982 Long term (current) use of aspirin: Secondary | ICD-10-CM | POA: Diagnosis not present

## 2017-04-25 DIAGNOSIS — D0511 Intraductal carcinoma in situ of right breast: Secondary | ICD-10-CM | POA: Insufficient documentation

## 2017-04-25 DIAGNOSIS — Z510 Encounter for antineoplastic radiation therapy: Secondary | ICD-10-CM | POA: Diagnosis not present

## 2017-04-25 DIAGNOSIS — Z170 Estrogen receptor positive status [ER+]: Secondary | ICD-10-CM | POA: Diagnosis not present

## 2017-04-25 DIAGNOSIS — Z79899 Other long term (current) drug therapy: Secondary | ICD-10-CM | POA: Diagnosis not present

## 2017-04-25 DIAGNOSIS — M129 Arthropathy, unspecified: Secondary | ICD-10-CM | POA: Diagnosis not present

## 2017-04-25 DIAGNOSIS — Z9049 Acquired absence of other specified parts of digestive tract: Secondary | ICD-10-CM | POA: Diagnosis not present

## 2017-04-25 DIAGNOSIS — Z87891 Personal history of nicotine dependence: Secondary | ICD-10-CM | POA: Diagnosis not present

## 2017-04-25 DIAGNOSIS — K219 Gastro-esophageal reflux disease without esophagitis: Secondary | ICD-10-CM | POA: Diagnosis not present

## 2017-04-25 LAB — CBC
HEMATOCRIT: 37 % (ref 35.0–47.0)
Hemoglobin: 12.6 g/dL (ref 12.0–16.0)
MCH: 32.9 pg (ref 26.0–34.0)
MCHC: 34.1 g/dL (ref 32.0–36.0)
MCV: 96.7 fL (ref 80.0–100.0)
Platelets: 278 10*3/uL (ref 150–440)
RBC: 3.82 MIL/uL (ref 3.80–5.20)
RDW: 14.5 % (ref 11.5–14.5)
WBC: 7.4 10*3/uL (ref 3.6–11.0)

## 2017-04-26 ENCOUNTER — Ambulatory Visit
Admission: RE | Admit: 2017-04-26 | Discharge: 2017-04-26 | Disposition: A | Discharge status: Home / Self Care | Payer: Medicare HMO | Source: Ambulatory Visit | Attending: Radiation Oncology | Admitting: Radiation Oncology

## 2017-04-26 DIAGNOSIS — Z510 Encounter for antineoplastic radiation therapy: Secondary | ICD-10-CM | POA: Diagnosis not present

## 2017-04-26 DIAGNOSIS — D0511 Intraductal carcinoma in situ of right breast: Secondary | ICD-10-CM | POA: Diagnosis not present

## 2017-04-26 DIAGNOSIS — Z87891 Personal history of nicotine dependence: Secondary | ICD-10-CM | POA: Diagnosis not present

## 2017-04-26 DIAGNOSIS — M129 Arthropathy, unspecified: Secondary | ICD-10-CM | POA: Diagnosis not present

## 2017-04-26 DIAGNOSIS — Z170 Estrogen receptor positive status [ER+]: Secondary | ICD-10-CM | POA: Diagnosis not present

## 2017-04-26 DIAGNOSIS — Z79899 Other long term (current) drug therapy: Secondary | ICD-10-CM | POA: Diagnosis not present

## 2017-04-26 DIAGNOSIS — Z7982 Long term (current) use of aspirin: Secondary | ICD-10-CM | POA: Diagnosis not present

## 2017-04-26 DIAGNOSIS — K219 Gastro-esophageal reflux disease without esophagitis: Secondary | ICD-10-CM | POA: Diagnosis not present

## 2017-04-26 DIAGNOSIS — Z9049 Acquired absence of other specified parts of digestive tract: Secondary | ICD-10-CM | POA: Diagnosis not present

## 2017-04-27 ENCOUNTER — Ambulatory Visit
Admission: RE | Admit: 2017-04-27 | Discharge: 2017-04-27 | Disposition: A | Discharge status: Home / Self Care | Payer: Medicare HMO | Source: Ambulatory Visit | Attending: Radiation Oncology | Admitting: Radiation Oncology

## 2017-04-27 DIAGNOSIS — Z87891 Personal history of nicotine dependence: Secondary | ICD-10-CM | POA: Diagnosis not present

## 2017-04-27 DIAGNOSIS — K219 Gastro-esophageal reflux disease without esophagitis: Secondary | ICD-10-CM | POA: Diagnosis not present

## 2017-04-27 DIAGNOSIS — Z79899 Other long term (current) drug therapy: Secondary | ICD-10-CM | POA: Diagnosis not present

## 2017-04-27 DIAGNOSIS — Z7982 Long term (current) use of aspirin: Secondary | ICD-10-CM | POA: Diagnosis not present

## 2017-04-27 DIAGNOSIS — Z510 Encounter for antineoplastic radiation therapy: Secondary | ICD-10-CM | POA: Diagnosis not present

## 2017-04-27 DIAGNOSIS — Z9049 Acquired absence of other specified parts of digestive tract: Secondary | ICD-10-CM | POA: Diagnosis not present

## 2017-04-27 DIAGNOSIS — Z170 Estrogen receptor positive status [ER+]: Secondary | ICD-10-CM | POA: Diagnosis not present

## 2017-04-27 DIAGNOSIS — M129 Arthropathy, unspecified: Secondary | ICD-10-CM | POA: Diagnosis not present

## 2017-04-27 DIAGNOSIS — D0511 Intraductal carcinoma in situ of right breast: Secondary | ICD-10-CM | POA: Diagnosis not present

## 2017-04-28 ENCOUNTER — Ambulatory Visit
Admission: RE | Admit: 2017-04-28 | Discharge: 2017-04-28 | Disposition: A | Discharge status: Home / Self Care | Payer: Medicare HMO | Source: Ambulatory Visit | Attending: Radiation Oncology | Admitting: Radiation Oncology

## 2017-04-28 DIAGNOSIS — Z170 Estrogen receptor positive status [ER+]: Secondary | ICD-10-CM | POA: Diagnosis not present

## 2017-04-28 DIAGNOSIS — R5381 Other malaise: Secondary | ICD-10-CM | POA: Diagnosis not present

## 2017-04-28 DIAGNOSIS — M129 Arthropathy, unspecified: Secondary | ICD-10-CM | POA: Diagnosis not present

## 2017-04-28 DIAGNOSIS — Z23 Encounter for immunization: Secondary | ICD-10-CM | POA: Diagnosis not present

## 2017-04-28 DIAGNOSIS — Z79899 Other long term (current) drug therapy: Secondary | ICD-10-CM | POA: Diagnosis not present

## 2017-04-28 DIAGNOSIS — M810 Age-related osteoporosis without current pathological fracture: Secondary | ICD-10-CM | POA: Diagnosis not present

## 2017-04-28 DIAGNOSIS — R5383 Other fatigue: Secondary | ICD-10-CM | POA: Diagnosis not present

## 2017-04-28 DIAGNOSIS — Z9049 Acquired absence of other specified parts of digestive tract: Secondary | ICD-10-CM | POA: Diagnosis not present

## 2017-04-28 DIAGNOSIS — D0511 Intraductal carcinoma in situ of right breast: Secondary | ICD-10-CM | POA: Diagnosis not present

## 2017-04-28 DIAGNOSIS — K219 Gastro-esophageal reflux disease without esophagitis: Secondary | ICD-10-CM | POA: Diagnosis not present

## 2017-04-28 DIAGNOSIS — Z7982 Long term (current) use of aspirin: Secondary | ICD-10-CM | POA: Diagnosis not present

## 2017-04-28 DIAGNOSIS — Z87891 Personal history of nicotine dependence: Secondary | ICD-10-CM | POA: Diagnosis not present

## 2017-04-28 DIAGNOSIS — D649 Anemia, unspecified: Secondary | ICD-10-CM | POA: Diagnosis not present

## 2017-04-28 DIAGNOSIS — Z510 Encounter for antineoplastic radiation therapy: Secondary | ICD-10-CM | POA: Diagnosis not present

## 2017-04-28 DIAGNOSIS — M05751 Rheumatoid arthritis with rheumatoid factor of right hip without organ or systems involvement: Secondary | ICD-10-CM | POA: Diagnosis not present

## 2017-04-28 DIAGNOSIS — I10 Essential (primary) hypertension: Secondary | ICD-10-CM | POA: Diagnosis not present

## 2017-05-01 ENCOUNTER — Ambulatory Visit
Admission: RE | Admit: 2017-05-01 | Discharge: 2017-05-01 | Disposition: A | Discharge status: Home / Self Care | Payer: Medicare HMO | Source: Ambulatory Visit | Attending: Radiation Oncology | Admitting: Radiation Oncology

## 2017-05-01 DIAGNOSIS — Z79899 Other long term (current) drug therapy: Secondary | ICD-10-CM | POA: Diagnosis not present

## 2017-05-01 DIAGNOSIS — Z170 Estrogen receptor positive status [ER+]: Secondary | ICD-10-CM | POA: Diagnosis not present

## 2017-05-01 DIAGNOSIS — D0511 Intraductal carcinoma in situ of right breast: Secondary | ICD-10-CM | POA: Diagnosis not present

## 2017-05-01 DIAGNOSIS — M129 Arthropathy, unspecified: Secondary | ICD-10-CM | POA: Diagnosis not present

## 2017-05-01 DIAGNOSIS — Z87891 Personal history of nicotine dependence: Secondary | ICD-10-CM | POA: Diagnosis not present

## 2017-05-01 DIAGNOSIS — Z510 Encounter for antineoplastic radiation therapy: Secondary | ICD-10-CM | POA: Diagnosis not present

## 2017-05-01 DIAGNOSIS — Z7982 Long term (current) use of aspirin: Secondary | ICD-10-CM | POA: Diagnosis not present

## 2017-05-01 DIAGNOSIS — K219 Gastro-esophageal reflux disease without esophagitis: Secondary | ICD-10-CM | POA: Diagnosis not present

## 2017-05-01 DIAGNOSIS — Z9049 Acquired absence of other specified parts of digestive tract: Secondary | ICD-10-CM | POA: Diagnosis not present

## 2017-05-02 ENCOUNTER — Ambulatory Visit
Admission: RE | Admit: 2017-05-02 | Discharge: 2017-05-02 | Disposition: A | Discharge status: Home / Self Care | Payer: Medicare HMO | Source: Ambulatory Visit | Attending: Radiation Oncology | Admitting: Radiation Oncology

## 2017-05-02 DIAGNOSIS — Z510 Encounter for antineoplastic radiation therapy: Secondary | ICD-10-CM | POA: Diagnosis not present

## 2017-05-02 DIAGNOSIS — K219 Gastro-esophageal reflux disease without esophagitis: Secondary | ICD-10-CM | POA: Diagnosis not present

## 2017-05-02 DIAGNOSIS — Z170 Estrogen receptor positive status [ER+]: Secondary | ICD-10-CM | POA: Diagnosis not present

## 2017-05-02 DIAGNOSIS — Z87891 Personal history of nicotine dependence: Secondary | ICD-10-CM | POA: Diagnosis not present

## 2017-05-02 DIAGNOSIS — D0511 Intraductal carcinoma in situ of right breast: Secondary | ICD-10-CM | POA: Diagnosis not present

## 2017-05-02 DIAGNOSIS — Z79899 Other long term (current) drug therapy: Secondary | ICD-10-CM | POA: Diagnosis not present

## 2017-05-02 DIAGNOSIS — Z9049 Acquired absence of other specified parts of digestive tract: Secondary | ICD-10-CM | POA: Diagnosis not present

## 2017-05-02 DIAGNOSIS — Z7982 Long term (current) use of aspirin: Secondary | ICD-10-CM | POA: Diagnosis not present

## 2017-05-02 DIAGNOSIS — M129 Arthropathy, unspecified: Secondary | ICD-10-CM | POA: Diagnosis not present

## 2017-05-03 ENCOUNTER — Ambulatory Visit
Admission: RE | Admit: 2017-05-03 | Discharge: 2017-05-03 | Disposition: A | Discharge status: Home / Self Care | Payer: Medicare HMO | Source: Ambulatory Visit | Attending: Radiation Oncology | Admitting: Radiation Oncology

## 2017-05-03 DIAGNOSIS — Z170 Estrogen receptor positive status [ER+]: Secondary | ICD-10-CM | POA: Diagnosis not present

## 2017-05-03 DIAGNOSIS — Z9049 Acquired absence of other specified parts of digestive tract: Secondary | ICD-10-CM | POA: Diagnosis not present

## 2017-05-03 DIAGNOSIS — Z79899 Other long term (current) drug therapy: Secondary | ICD-10-CM | POA: Diagnosis not present

## 2017-05-03 DIAGNOSIS — K219 Gastro-esophageal reflux disease without esophagitis: Secondary | ICD-10-CM | POA: Diagnosis not present

## 2017-05-03 DIAGNOSIS — Z7982 Long term (current) use of aspirin: Secondary | ICD-10-CM | POA: Diagnosis not present

## 2017-05-03 DIAGNOSIS — D0511 Intraductal carcinoma in situ of right breast: Secondary | ICD-10-CM | POA: Diagnosis not present

## 2017-05-03 DIAGNOSIS — Z510 Encounter for antineoplastic radiation therapy: Secondary | ICD-10-CM | POA: Diagnosis not present

## 2017-05-03 DIAGNOSIS — M129 Arthropathy, unspecified: Secondary | ICD-10-CM | POA: Diagnosis not present

## 2017-05-03 DIAGNOSIS — Z87891 Personal history of nicotine dependence: Secondary | ICD-10-CM | POA: Diagnosis not present

## 2017-05-04 ENCOUNTER — Ambulatory Visit
Admission: RE | Admit: 2017-05-04 | Discharge: 2017-05-04 | Disposition: A | Discharge status: Home / Self Care | Payer: Medicare HMO | Source: Ambulatory Visit | Attending: Radiation Oncology | Admitting: Radiation Oncology

## 2017-05-04 DIAGNOSIS — Z170 Estrogen receptor positive status [ER+]: Secondary | ICD-10-CM | POA: Diagnosis not present

## 2017-05-04 DIAGNOSIS — D0511 Intraductal carcinoma in situ of right breast: Secondary | ICD-10-CM | POA: Diagnosis not present

## 2017-05-04 DIAGNOSIS — Z510 Encounter for antineoplastic radiation therapy: Secondary | ICD-10-CM | POA: Diagnosis not present

## 2017-05-04 DIAGNOSIS — Z79899 Other long term (current) drug therapy: Secondary | ICD-10-CM | POA: Diagnosis not present

## 2017-05-04 DIAGNOSIS — K219 Gastro-esophageal reflux disease without esophagitis: Secondary | ICD-10-CM | POA: Diagnosis not present

## 2017-05-04 DIAGNOSIS — Z7982 Long term (current) use of aspirin: Secondary | ICD-10-CM | POA: Diagnosis not present

## 2017-05-04 DIAGNOSIS — Z9049 Acquired absence of other specified parts of digestive tract: Secondary | ICD-10-CM | POA: Diagnosis not present

## 2017-05-04 DIAGNOSIS — M129 Arthropathy, unspecified: Secondary | ICD-10-CM | POA: Diagnosis not present

## 2017-05-04 DIAGNOSIS — Z87891 Personal history of nicotine dependence: Secondary | ICD-10-CM | POA: Diagnosis not present

## 2017-05-05 ENCOUNTER — Ambulatory Visit: Payer: Medicare HMO

## 2017-05-05 ENCOUNTER — Ambulatory Visit
Admission: RE | Admit: 2017-05-05 | Discharge: 2017-05-05 | Disposition: A | Discharge status: Home / Self Care | Payer: Medicare HMO | Source: Ambulatory Visit | Attending: Radiation Oncology | Admitting: Radiation Oncology

## 2017-05-05 DIAGNOSIS — Z7982 Long term (current) use of aspirin: Secondary | ICD-10-CM | POA: Diagnosis not present

## 2017-05-05 DIAGNOSIS — Z170 Estrogen receptor positive status [ER+]: Secondary | ICD-10-CM | POA: Diagnosis not present

## 2017-05-05 DIAGNOSIS — Z9049 Acquired absence of other specified parts of digestive tract: Secondary | ICD-10-CM | POA: Diagnosis not present

## 2017-05-05 DIAGNOSIS — Z87891 Personal history of nicotine dependence: Secondary | ICD-10-CM | POA: Diagnosis not present

## 2017-05-05 DIAGNOSIS — Z79899 Other long term (current) drug therapy: Secondary | ICD-10-CM | POA: Diagnosis not present

## 2017-05-05 DIAGNOSIS — K219 Gastro-esophageal reflux disease without esophagitis: Secondary | ICD-10-CM | POA: Diagnosis not present

## 2017-05-05 DIAGNOSIS — M129 Arthropathy, unspecified: Secondary | ICD-10-CM | POA: Diagnosis not present

## 2017-05-05 DIAGNOSIS — D0511 Intraductal carcinoma in situ of right breast: Secondary | ICD-10-CM | POA: Diagnosis not present

## 2017-05-05 DIAGNOSIS — Z510 Encounter for antineoplastic radiation therapy: Secondary | ICD-10-CM | POA: Diagnosis not present

## 2017-05-08 ENCOUNTER — Ambulatory Visit
Admission: RE | Admit: 2017-05-08 | Discharge: 2017-05-08 | Disposition: A | Discharge status: Home / Self Care | Payer: Medicare HMO | Source: Ambulatory Visit | Attending: Radiation Oncology | Admitting: Radiation Oncology

## 2017-05-08 DIAGNOSIS — Z87891 Personal history of nicotine dependence: Secondary | ICD-10-CM | POA: Diagnosis not present

## 2017-05-08 DIAGNOSIS — Z79899 Other long term (current) drug therapy: Secondary | ICD-10-CM | POA: Diagnosis not present

## 2017-05-08 DIAGNOSIS — Z170 Estrogen receptor positive status [ER+]: Secondary | ICD-10-CM | POA: Diagnosis not present

## 2017-05-08 DIAGNOSIS — Z9049 Acquired absence of other specified parts of digestive tract: Secondary | ICD-10-CM | POA: Diagnosis not present

## 2017-05-08 DIAGNOSIS — Z7982 Long term (current) use of aspirin: Secondary | ICD-10-CM | POA: Diagnosis not present

## 2017-05-08 DIAGNOSIS — K219 Gastro-esophageal reflux disease without esophagitis: Secondary | ICD-10-CM | POA: Diagnosis not present

## 2017-05-08 DIAGNOSIS — M129 Arthropathy, unspecified: Secondary | ICD-10-CM | POA: Diagnosis not present

## 2017-05-08 DIAGNOSIS — Z510 Encounter for antineoplastic radiation therapy: Secondary | ICD-10-CM | POA: Diagnosis not present

## 2017-05-08 DIAGNOSIS — D0511 Intraductal carcinoma in situ of right breast: Secondary | ICD-10-CM | POA: Diagnosis not present

## 2017-05-09 ENCOUNTER — Inpatient Hospital Stay: Payer: Medicare HMO

## 2017-05-09 ENCOUNTER — Ambulatory Visit
Admission: RE | Admit: 2017-05-09 | Discharge: 2017-05-09 | Disposition: A | Discharge status: Home / Self Care | Payer: Medicare HMO | Source: Ambulatory Visit | Attending: Radiation Oncology | Admitting: Radiation Oncology

## 2017-05-09 ENCOUNTER — Other Ambulatory Visit: Payer: Self-pay

## 2017-05-09 DIAGNOSIS — Z79899 Other long term (current) drug therapy: Secondary | ICD-10-CM | POA: Diagnosis not present

## 2017-05-09 DIAGNOSIS — Z170 Estrogen receptor positive status [ER+]: Secondary | ICD-10-CM | POA: Diagnosis not present

## 2017-05-09 DIAGNOSIS — Z510 Encounter for antineoplastic radiation therapy: Secondary | ICD-10-CM | POA: Diagnosis not present

## 2017-05-09 DIAGNOSIS — Z87891 Personal history of nicotine dependence: Secondary | ICD-10-CM | POA: Diagnosis not present

## 2017-05-09 DIAGNOSIS — D0511 Intraductal carcinoma in situ of right breast: Secondary | ICD-10-CM | POA: Diagnosis not present

## 2017-05-09 DIAGNOSIS — M129 Arthropathy, unspecified: Secondary | ICD-10-CM | POA: Diagnosis not present

## 2017-05-09 DIAGNOSIS — K219 Gastro-esophageal reflux disease without esophagitis: Secondary | ICD-10-CM | POA: Diagnosis not present

## 2017-05-09 DIAGNOSIS — Z7982 Long term (current) use of aspirin: Secondary | ICD-10-CM | POA: Diagnosis not present

## 2017-05-09 DIAGNOSIS — Z9049 Acquired absence of other specified parts of digestive tract: Secondary | ICD-10-CM | POA: Diagnosis not present

## 2017-05-09 LAB — CBC
HCT: 37.4 % (ref 35.0–47.0)
Hemoglobin: 12.6 g/dL (ref 12.0–16.0)
MCH: 32.5 pg (ref 26.0–34.0)
MCHC: 33.7 g/dL (ref 32.0–36.0)
MCV: 96.6 fL (ref 80.0–100.0)
PLATELETS: 259 10*3/uL (ref 150–440)
RBC: 3.87 MIL/uL (ref 3.80–5.20)
RDW: 14.1 % (ref 11.5–14.5)
WBC: 6.5 10*3/uL (ref 3.6–11.0)

## 2017-05-10 ENCOUNTER — Ambulatory Visit
Admission: RE | Admit: 2017-05-10 | Discharge: 2017-05-10 | Disposition: A | Discharge status: Home / Self Care | Payer: Medicare HMO | Source: Ambulatory Visit | Attending: Radiation Oncology | Admitting: Radiation Oncology

## 2017-05-10 ENCOUNTER — Ambulatory Visit: Payer: Medicare HMO

## 2017-05-10 DIAGNOSIS — K219 Gastro-esophageal reflux disease without esophagitis: Secondary | ICD-10-CM | POA: Diagnosis not present

## 2017-05-10 DIAGNOSIS — Z7982 Long term (current) use of aspirin: Secondary | ICD-10-CM | POA: Diagnosis not present

## 2017-05-10 DIAGNOSIS — M129 Arthropathy, unspecified: Secondary | ICD-10-CM | POA: Diagnosis not present

## 2017-05-10 DIAGNOSIS — Z87891 Personal history of nicotine dependence: Secondary | ICD-10-CM | POA: Diagnosis not present

## 2017-05-10 DIAGNOSIS — D0511 Intraductal carcinoma in situ of right breast: Secondary | ICD-10-CM | POA: Diagnosis not present

## 2017-05-10 DIAGNOSIS — Z170 Estrogen receptor positive status [ER+]: Secondary | ICD-10-CM | POA: Diagnosis not present

## 2017-05-10 DIAGNOSIS — Z79899 Other long term (current) drug therapy: Secondary | ICD-10-CM | POA: Diagnosis not present

## 2017-05-10 DIAGNOSIS — Z9049 Acquired absence of other specified parts of digestive tract: Secondary | ICD-10-CM | POA: Diagnosis not present

## 2017-05-10 DIAGNOSIS — Z510 Encounter for antineoplastic radiation therapy: Secondary | ICD-10-CM | POA: Diagnosis not present

## 2017-05-15 ENCOUNTER — Ambulatory Visit: Payer: Medicare HMO

## 2017-05-15 ENCOUNTER — Ambulatory Visit
Admission: RE | Admit: 2017-05-15 | Discharge: 2017-05-15 | Disposition: A | Discharge status: Home / Self Care | Payer: Medicare HMO | Source: Ambulatory Visit | Attending: Radiation Oncology | Admitting: Radiation Oncology

## 2017-05-15 DIAGNOSIS — Z170 Estrogen receptor positive status [ER+]: Secondary | ICD-10-CM | POA: Diagnosis not present

## 2017-05-15 DIAGNOSIS — Z510 Encounter for antineoplastic radiation therapy: Secondary | ICD-10-CM | POA: Diagnosis not present

## 2017-05-15 DIAGNOSIS — Z79899 Other long term (current) drug therapy: Secondary | ICD-10-CM | POA: Diagnosis not present

## 2017-05-15 DIAGNOSIS — K219 Gastro-esophageal reflux disease without esophagitis: Secondary | ICD-10-CM | POA: Diagnosis not present

## 2017-05-15 DIAGNOSIS — M129 Arthropathy, unspecified: Secondary | ICD-10-CM | POA: Diagnosis not present

## 2017-05-15 DIAGNOSIS — Z9049 Acquired absence of other specified parts of digestive tract: Secondary | ICD-10-CM | POA: Diagnosis not present

## 2017-05-15 DIAGNOSIS — D0511 Intraductal carcinoma in situ of right breast: Secondary | ICD-10-CM | POA: Diagnosis not present

## 2017-05-15 DIAGNOSIS — Z7982 Long term (current) use of aspirin: Secondary | ICD-10-CM | POA: Diagnosis not present

## 2017-05-15 DIAGNOSIS — Z87891 Personal history of nicotine dependence: Secondary | ICD-10-CM | POA: Diagnosis not present

## 2017-06-06 ENCOUNTER — Encounter: Payer: Self-pay | Admitting: General Surgery

## 2017-06-06 ENCOUNTER — Ambulatory Visit (INDEPENDENT_AMBULATORY_CARE_PROVIDER_SITE_OTHER): Payer: Medicare HMO | Admitting: General Surgery

## 2017-06-06 VITALS — BP 124/60 | HR 80 | Resp 14 | Ht 63.0 in | Wt 135.0 lb

## 2017-06-06 DIAGNOSIS — D0511 Intraductal carcinoma in situ of right breast: Secondary | ICD-10-CM

## 2017-06-06 MED ORDER — TAMOXIFEN CITRATE 20 MG PO TABS: 20.0000 mg | ORAL_TABLET | Freq: Every day | ORAL | 12 refills | Status: DC

## 2017-06-06 NOTE — Progress Notes (Signed)
Patient ID: Kristina Medina, female   DOB: 1932-10-23, 83 y.o.   MRN: [id number]  Chief Complaint  Patient presents with  . Follow-up    HPI Kristina Medina is a 83 y.o. female here today for her follow up breast cancer check up. Patient finished her treatments on 05/15/2017. She states she is doing well.   HPI  Past Medical History:  Diagnosis Date  . Anemia   . Arthritis   . Cancer ([place]) 01/31/2017   right breast ER< 10 %; PR: Neg.Nodes negative x 4.  . Complication of anesthesia   . GERD (gastroesophageal reflux disease)   . History of kidney stones   . Hypertension   . Irregular heart beat   . Osteoporosis   . Patient is Jehovah's Witness   . PONV (postoperative nausea and vomiting)    WITH GALLBLADDER SURGERY  . S/P right breast biopsy 1966    Past Surgical History:  Procedure Laterality Date  . ANKLE SURGERY Right 2005  . BREAST BIOPSY Right 1966   neg  . BREAST BIOPSY Right 01/06/2017   APOCRINE DUCTAL CARCINOMA IN SITU (DCIS),   . BREAST EXCISIONAL BIOPSY Right 01/31/2017   right breast lumpectomy  . BREAST LUMPECTOMY WITH SENTINEL LYMPH NODE BIOPSY Right 01/31/2017   Procedure: BREAST LUMPECTOMY WITH SENTINEL LYMPH NODE BX;  Surgeon: Robert Bellow, MD;  Location: [facility] ORS;  Service: General;  Laterality: Right;  . CHOLECYSTECTOMY    . COLONOSCOPY    . ROTATOR CUFF REPAIR  2004    Family History  Problem Relation Age of Onset  . Breast cancer Sister 101  . Breast cancer Cousin   . Melanoma Brother   . Lupus Sister     Social History Social History   Tobacco Use  . Smoking status: Former Smoker    Packs/day: 0.25    Years: 29.00    Pack years: 7.25    Types: Cigarettes    Last attempt to quit: 1984    Years since quitting: 34.9  . Smokeless tobacco: Never Used  Substance Use Topics  . Alcohol use: No  . Drug use: No    No Known Allergies  Current Outpatient Medications  Medication Sig Dispense Refill  . alendronate (FOSAMAX) 70 MG  tablet Take 70 mg by mouth every Friday. Take with a full glass of water on an empty stomach.     Marland Kitchen aspirin EC 81 MG tablet Take 81 mg by mouth daily.    . Calcium Carb-Cholecalciferol (CALCIUM 600+D3 PO) Take 1 tablet by mouth 2 (two) times daily.    . cetirizine (ZYRTEC) 10 MG tablet Take 10 mg by mouth daily.    Marland Kitchen diltiazem (DILACOR XR) 240 MG 24 hr capsule Take 240 mg by mouth every morning.     . ergocalciferol (VITAMIN D2) 50000 units capsule Take 50,000 Units by mouth every Friday.     . esomeprazole (NEXIUM) 40 MG capsule Take 40 mg by mouth every morning.     . folic acid (FOLVITE) 1 MG tablet Take 1 mg by mouth daily.    . hydrochlorothiazide (HYDRODIURIL) 25 MG tablet Take 25 mg by mouth daily.    . methotrexate 2.5 MG tablet Take 12.5 mg by mouth 2 (two) times a week. Takes on Thursday and Friday    . mirtazapine (REMERON) 15 MG tablet Take 15 mg by mouth at bedtime.    . Multiple Vitamin (MULTIVITAMIN WITH MINERALS) TABS tablet Take 1 tablet by mouth daily.    Marland Kitchen  potassium chloride SA (K-DUR,KLOR-CON) 20 MEQ tablet Take 20 mEq by mouth 2 (two) times daily.     . tamoxifen (NOLVADEX) 20 MG tablet Take 1 tablet (20 mg total) by mouth daily. 30 tablet 12   No current facility-administered medications for this visit.     Review of Systems Review of Systems  Constitutional: Negative.   Respiratory: Negative.   Cardiovascular: Negative.     Blood pressure 124/60, pulse 80, resp. rate 14, height 5\' 3"  (1.6 m), weight 135 lb (61.2 kg).  Physical Exam Physical Exam  Constitutional: She is oriented to person, place, and time. She appears well-developed and well-nourished.  Cardiovascular: Normal rate, regular rhythm and normal heart sounds.  Pulmonary/Chest: Effort normal and breath sounds normal.    Neurological: She is alert and oriented to person, place, and time.  Skin: Skin is warm and dry.    Data Reviewed Bone density examination of 03/16/2017 showed osteoporosis in  the forearm and osteopenia in the spine.  DCIS showed an ER component of 10%.  Assessment    Doing well post wide excision, post radiation changes.    Plan    Considering her bone density exam, I think it's reasonable to make use of a trial of tamoxifen. Potential side effects reviewed. Risk of DVT discussed and need to promptly report any unusual lower extremity swelling or shortness of breath. This will hopefully improve with tamoxifen therapy.    Tamoxifen sent to phramcy. Try it for one month. Call and report after a month of taking Tamoxifen. Start in January 2019.  Return in July for bilateral diagnotic mammograms.  The patient is aware to call back for any questions or concerns.   HPI, Physical Exam, Assessment and Plan have been scribed under the direction and in the presence of Hervey Ard, MD.  Gaspar Cola, CMA  I have completed the exam and reviewed the above documentation for accuracy and completeness.  I agree with the above.  Haematologist has been used and any errors in dictation or transcription are unintentional.  Hervey Ard, M.D., F.A.C.S.  Robert Bellow 06/06/2017, 6:57 PM

## 2017-06-06 NOTE — Patient Instructions (Addendum)
Tamoxifen sent to phramcy. Try it for one month. Call and report after a month of taking Tamoxifen. . Return in July for bilateral diagnotic mammograms.  The patient is aware to call back for any questions or concerns. Kristina Medina

## 2017-06-21 ENCOUNTER — Telehealth: Payer: Self-pay

## 2017-06-21 NOTE — Telephone Encounter (Signed)
Patient to continue Tamoxifen at present.  Arrange for f/u report re: vaginal discharge at that time, earlier if it becomes more pronounced.

## 2017-06-21 NOTE — Telephone Encounter (Signed)
Patient called to report on her progress so far of taking her Tamoxifen. She reports no hot flashes or joint or leg pain. She does report some slight off and on vaginal discharge that started on 06/19/17. She also reports some mild belly discomfort that comes and goes since 06/16/17. She will call back once she has been on this medication for a month to give a update.

## 2017-06-26 ENCOUNTER — Encounter: Payer: Self-pay | Admitting: Radiation Oncology

## 2017-06-26 ENCOUNTER — Other Ambulatory Visit: Payer: Self-pay

## 2017-06-26 ENCOUNTER — Ambulatory Visit
Admission: RE | Admit: 2017-06-26 | Discharge: 2017-06-26 | Disposition: A | Discharge status: Home / Self Care | Payer: Medicare HMO | Source: Ambulatory Visit | Attending: Radiation Oncology | Admitting: Radiation Oncology

## 2017-06-26 VITALS — BP 159/76 | HR 66 | Temp 96.5°F | Resp 18 | Wt 133.9 lb

## 2017-06-26 DIAGNOSIS — D0511 Intraductal carcinoma in situ of right breast: Secondary | ICD-10-CM | POA: Diagnosis not present

## 2017-06-26 DIAGNOSIS — Z79810 Long term (current) use of selective estrogen receptor modulators (SERMs): Secondary | ICD-10-CM | POA: Diagnosis not present

## 2017-06-26 DIAGNOSIS — Z923 Personal history of irradiation: Secondary | ICD-10-CM | POA: Diagnosis not present

## 2017-06-26 DIAGNOSIS — Z170 Estrogen receptor positive status [ER+]: Secondary | ICD-10-CM | POA: Diagnosis not present

## 2017-06-26 NOTE — Progress Notes (Signed)
Radiation Oncology Follow up Note  Name: Kristina Medina   Date:   06/26/2017 MRN:  [id number] DOB: Jan 09, 1933    This 85 y.o. female presents to the clinic today for one-month follow-up status post whole breast radiation to her right breast for ER/PR positive ductal carcinoma in situ.  REFERRING PROVIDER: Tracie Harrier, MD  HPI: Patient is an 84 year old female now seen out 1 month having completed whole breast radiation to her right breast for ductal carcinoma in situ ER/PR positive. She is seen today in routine follow-up is doing well still has some swelling especially on the nipple areolar complex. Otherwise is without complaint..  COMPLICATIONS OF TREATMENT: none  FOLLOW UP COMPLIANCE: keeps appointments   PHYSICAL EXAM:  BP (!) 159/76   Pulse 66   Temp (!) 96.5 F (35.8 C)   Resp 18   Wt 133 lb 14.9 oz (60.7 kg)   BMI 23.72 kg/m  Lungs are clear to A&P cardiac examination essentially unremarkable with regular rate and rhythm. No dominant mass or nodularity is noted in either breast in 2 positions examined. Incision is well-healed. No axillary or supraclavicular adenopathy is appreciated. Cosmetic result is good. There is still some retraction of the breast towards her incision site and some swelling of the nipple areolar complex. Well-developed well-nourished patient in NAD. HEENT reveals PERLA, EOMI, discs not visualized.  Oral cavity is clear. No oral mucosal lesions are identified. Neck is clear without evidence of cervical or supraclavicular adenopathy. Lungs are clear to A&P. Cardiac examination is essentially unremarkable with regular rate and rhythm without murmur rub or thrill. Abdomen is benign with no organomegaly or masses noted. Motor sensory and DTR levels are equal and symmetric in the upper and lower extremities. Cranial nerves II through XII are grossly intact. Proprioception is intact. No peripheral adenopathy or edema is identified. No motor or sensory levels  are noted. Crude visual fields are within normal range.  RADIOLOGY RESULTS: No current films for review  PLAN: Present time patient is doing well. Breast is still somewhat hyperpigmented and swollen although it is resolving. She is currently on tamoxifen tolerating that well without side effect. I've asked to see her out in 3-4 months for follow-up. Patient knows to call sooner with any concerns.  I would like to take this opportunity to thank you for allowing me to participate in the care of your patient.Noreene Filbert, MD

## 2017-07-31 ENCOUNTER — Telehealth: Payer: Self-pay | Admitting: General Surgery

## 2017-07-31 NOTE — Telephone Encounter (Signed)
Patient called to give A REPORT ON HER TAKEN TAMOXIFEN 20MG .She states it seem fine with only a few side effects such as a few hot flashes & vaginal discharge sometimes.

## 2017-08-23 DIAGNOSIS — D649 Anemia, unspecified: Secondary | ICD-10-CM | POA: Diagnosis not present

## 2017-08-23 DIAGNOSIS — M05751 Rheumatoid arthritis with rheumatoid factor of right hip without organ or systems involvement: Secondary | ICD-10-CM | POA: Diagnosis not present

## 2017-08-23 DIAGNOSIS — I10 Essential (primary) hypertension: Secondary | ICD-10-CM | POA: Diagnosis not present

## 2017-08-23 DIAGNOSIS — K219 Gastro-esophageal reflux disease without esophagitis: Secondary | ICD-10-CM | POA: Diagnosis not present

## 2017-08-23 DIAGNOSIS — R5381 Other malaise: Secondary | ICD-10-CM | POA: Diagnosis not present

## 2017-08-23 DIAGNOSIS — R5383 Other fatigue: Secondary | ICD-10-CM | POA: Diagnosis not present

## 2017-08-29 DIAGNOSIS — Z87891 Personal history of nicotine dependence: Secondary | ICD-10-CM | POA: Insufficient documentation

## 2017-08-29 DIAGNOSIS — F5104 Psychophysiologic insomnia: Secondary | ICD-10-CM | POA: Diagnosis not present

## 2017-08-29 DIAGNOSIS — J069 Acute upper respiratory infection, unspecified: Secondary | ICD-10-CM | POA: Diagnosis not present

## 2017-08-29 DIAGNOSIS — M159 Polyosteoarthritis, unspecified: Secondary | ICD-10-CM | POA: Diagnosis not present

## 2017-08-29 DIAGNOSIS — K219 Gastro-esophageal reflux disease without esophagitis: Secondary | ICD-10-CM | POA: Diagnosis not present

## 2017-08-29 DIAGNOSIS — I10 Essential (primary) hypertension: Secondary | ICD-10-CM | POA: Diagnosis not present

## 2017-08-29 DIAGNOSIS — D0511 Intraductal carcinoma in situ of right breast: Secondary | ICD-10-CM | POA: Diagnosis not present

## 2017-08-29 DIAGNOSIS — Z923 Personal history of irradiation: Secondary | ICD-10-CM | POA: Insufficient documentation

## 2017-08-29 DIAGNOSIS — M05751 Rheumatoid arthritis with rheumatoid factor of right hip without organ or systems involvement: Secondary | ICD-10-CM | POA: Diagnosis not present

## 2017-08-29 DIAGNOSIS — Z0000 Encounter for general adult medical examination without abnormal findings: Secondary | ICD-10-CM | POA: Diagnosis not present

## 2017-10-18 DIAGNOSIS — Z79899 Other long term (current) drug therapy: Secondary | ICD-10-CM | POA: Diagnosis not present

## 2017-10-18 DIAGNOSIS — M0579 Rheumatoid arthritis with rheumatoid factor of multiple sites without organ or systems involvement: Secondary | ICD-10-CM | POA: Diagnosis not present

## 2017-10-18 DIAGNOSIS — G8929 Other chronic pain: Secondary | ICD-10-CM | POA: Diagnosis not present

## 2017-10-18 DIAGNOSIS — M25561 Pain in right knee: Secondary | ICD-10-CM | POA: Diagnosis not present

## 2017-10-18 DIAGNOSIS — M810 Age-related osteoporosis without current pathological fracture: Secondary | ICD-10-CM | POA: Diagnosis not present

## 2017-11-06 ENCOUNTER — Other Ambulatory Visit: Payer: Self-pay

## 2017-11-06 DIAGNOSIS — D0511 Intraductal carcinoma in situ of right breast: Secondary | ICD-10-CM

## 2017-11-27 ENCOUNTER — Encounter: Payer: Self-pay | Admitting: Radiation Oncology

## 2017-11-27 ENCOUNTER — Ambulatory Visit
Admission: RE | Admit: 2017-11-27 | Discharge: 2017-11-27 | Disposition: A | Discharge status: Home / Self Care | Payer: Medicare HMO | Source: Ambulatory Visit | Attending: Radiation Oncology | Admitting: Radiation Oncology

## 2017-11-27 ENCOUNTER — Other Ambulatory Visit: Payer: Self-pay

## 2017-11-27 VITALS — BP 143/80 | HR 83 | Temp 98.6°F | Resp 18 | Wt 131.5 lb

## 2017-11-27 DIAGNOSIS — Z170 Estrogen receptor positive status [ER+]: Secondary | ICD-10-CM | POA: Diagnosis not present

## 2017-11-27 DIAGNOSIS — Z923 Personal history of irradiation: Secondary | ICD-10-CM | POA: Diagnosis not present

## 2017-11-27 DIAGNOSIS — D0511 Intraductal carcinoma in situ of right breast: Secondary | ICD-10-CM | POA: Insufficient documentation

## 2017-11-27 DIAGNOSIS — Z79810 Long term (current) use of selective estrogen receptor modulators (SERMs): Secondary | ICD-10-CM | POA: Diagnosis not present

## 2017-11-27 NOTE — Progress Notes (Signed)
Radiation Oncology Follow up Note  Name: Kristina Medina   Date:   11/27/2017 MRN:  [id number] DOB: [birth year]    This 84 y.o. female presents to the clinic today for 6 month follow-up status post whole breast radiation to her right breast for ER/PR positive ductal carcinoma in situ.  REFERRING PROVIDER: Tracie Harrier, MD  HPI: patient is an 84 year old female now out 6 months having completed radiation therapy to her right breast for ER/PR positive ductal carcinoma in situ. Seen today in routine follow-up she is doing well. She specifically denies breast tenderness cough or bone pain. She has mammogram scheduled for next month. She is currently on.tamoxifen tolerating that well without side effect.  COMPLICATIONS OF TREATMENT: none  FOLLOW UP COMPLIANCE: keeps appointments   PHYSICAL EXAM:  BP (!) 143/80   Pulse 83   Temp 98.6 F (37 C)   Resp 18   Wt 131 lb 8.1 oz (59.6 kg)   BMI 23.29 kg/m  Lungs are clear to A&P cardiac examination essentially unremarkable with regular rate and rhythm. No dominant mass or nodularity is noted in either breast in 2 positions examined. Incision is well-healed. No axillary or supraclavicular adenopathy is appreciated. Cosmetic result is fair. There is retraction of breast towards her initial incision. Well-developed well-nourished patient in NAD. HEENT reveals PERLA, EOMI, discs not visualized.  Oral cavity is clear. No oral mucosal lesions are identified. Neck is clear without evidence of cervical or supraclavicular adenopathy. Lungs are clear to A&P. Cardiac examination is essentially unremarkable with regular rate and rhythm without murmur rub or thrill. Abdomen is benign with no organomegaly or masses noted. Motor sensory and DTR levels are equal and symmetric in the upper and lower extremities. Cranial nerves II through XII are grossly intact. Proprioception is intact. No peripheral adenopathy or edema is identified. No motor or sensory levels  are noted. Crude visual fields are within normal range.  RADIOLOGY RESULTS: no current films for review  PLAN: present time patient is doing well with no evidence of disease. I've asked to see her back in 6 months for follow-up and then will start once your follow-up visits. She is scheduled for mammograms next month. She continues on tamoxifen without side effect. Patient is to call sooner with any concerns.  I would like to take this opportunity to thank you for allowing me to participate in the care of your patient.Noreene Filbert, MD

## 2018-01-01 ENCOUNTER — Ambulatory Visit
Admission: RE | Admit: 2018-01-01 | Discharge: 2018-01-01 | Disposition: A | Discharge status: Home / Self Care | Payer: Medicare HMO | Source: Ambulatory Visit | Attending: General Surgery | Admitting: General Surgery

## 2018-01-01 DIAGNOSIS — D0511 Intraductal carcinoma in situ of right breast: Secondary | ICD-10-CM | POA: Diagnosis not present

## 2018-01-01 DIAGNOSIS — R922 Inconclusive mammogram: Secondary | ICD-10-CM | POA: Diagnosis not present

## 2018-01-01 HISTORY — DX: Personal history of irradiation: Z92.3

## 2018-01-01 HISTORY — DX: Malignant neoplasm of unspecified site of unspecified female breast: C50.919

## 2018-01-03 DIAGNOSIS — F5104 Psychophysiologic insomnia: Secondary | ICD-10-CM | POA: Diagnosis not present

## 2018-01-03 DIAGNOSIS — D0511 Intraductal carcinoma in situ of right breast: Secondary | ICD-10-CM | POA: Diagnosis not present

## 2018-01-03 DIAGNOSIS — I10 Essential (primary) hypertension: Secondary | ICD-10-CM | POA: Diagnosis not present

## 2018-01-03 DIAGNOSIS — M159 Polyosteoarthritis, unspecified: Secondary | ICD-10-CM | POA: Diagnosis not present

## 2018-01-03 DIAGNOSIS — J069 Acute upper respiratory infection, unspecified: Secondary | ICD-10-CM | POA: Diagnosis not present

## 2018-01-03 DIAGNOSIS — M05751 Rheumatoid arthritis with rheumatoid factor of right hip without organ or systems involvement: Secondary | ICD-10-CM | POA: Diagnosis not present

## 2018-01-03 DIAGNOSIS — D649 Anemia, unspecified: Secondary | ICD-10-CM | POA: Diagnosis not present

## 2018-01-10 DIAGNOSIS — M159 Polyosteoarthritis, unspecified: Secondary | ICD-10-CM | POA: Diagnosis not present

## 2018-01-10 DIAGNOSIS — Z0000 Encounter for general adult medical examination without abnormal findings: Secondary | ICD-10-CM | POA: Diagnosis not present

## 2018-01-10 DIAGNOSIS — K219 Gastro-esophageal reflux disease without esophagitis: Secondary | ICD-10-CM | POA: Diagnosis not present

## 2018-01-10 DIAGNOSIS — I10 Essential (primary) hypertension: Secondary | ICD-10-CM | POA: Diagnosis not present

## 2018-01-10 DIAGNOSIS — M05751 Rheumatoid arthritis with rheumatoid factor of right hip without organ or systems involvement: Secondary | ICD-10-CM | POA: Diagnosis not present

## 2018-01-10 DIAGNOSIS — D0511 Intraductal carcinoma in situ of right breast: Secondary | ICD-10-CM | POA: Diagnosis not present

## 2018-01-10 DIAGNOSIS — Z8739 Personal history of other diseases of the musculoskeletal system and connective tissue: Secondary | ICD-10-CM | POA: Diagnosis not present

## 2018-01-10 DIAGNOSIS — D649 Anemia, unspecified: Secondary | ICD-10-CM | POA: Diagnosis not present

## 2018-01-11 ENCOUNTER — Encounter: Payer: Self-pay | Admitting: General Surgery

## 2018-01-11 ENCOUNTER — Ambulatory Visit: Payer: Medicare HMO | Admitting: General Surgery

## 2018-01-11 VITALS — BP 120/72 | HR 68 | Resp 12 | Ht 62.0 in | Wt 129.0 lb

## 2018-01-11 DIAGNOSIS — D0511 Intraductal carcinoma in situ of right breast: Secondary | ICD-10-CM | POA: Diagnosis not present

## 2018-01-11 NOTE — Patient Instructions (Addendum)
Patient will be asked to return to the office in one year with a bilateral diagnotic mammogram. The patient is aware to call back for any questions or concerns. 

## 2018-01-11 NOTE — Progress Notes (Signed)
Patient ID: Kristina Medina, female   DOB: 1933-01-24, 84 y.o.   MRN: [id number]  Chief Complaint  Patient presents with  . Follow-up    HPI Kristina Medina is a 84 y.o. female who presents for a breast evaluation. The most recent mammogram was done on 01/01/2018.  Patient does perform regular self breast checks and gets regular mammograms done.    HPI  Past Medical History:  Diagnosis Date  . Anemia   . Arthritis   . Breast cancer ([place]) 01/06/2017   right breast  . Cancer ([place]) 01/31/2017   right breast ER< 10 %; PR: Neg.Nodes negative x 4.  . Complication of anesthesia   . GERD (gastroesophageal reflux disease)   . History of kidney stones   . Hypertension   . Irregular heart beat   . Osteoporosis   . Patient is Jehovah's Witness   . Personal history of radiation therapy   . PONV (postoperative nausea and vomiting)    WITH GALLBLADDER SURGERY  . S/P right breast biopsy 1966    Past Surgical History:  Procedure Laterality Date  . ANKLE SURGERY Right 2005  . BREAST BIOPSY Right 1966   neg  . BREAST BIOPSY Right 01/06/2017   APOCRINE DUCTAL CARCINOMA IN SITU (DCIS),   . BREAST EXCISIONAL BIOPSY Right 01/31/2017   right breast lumpectomy  . BREAST LUMPECTOMY Right 01/31/2017   positive  . BREAST LUMPECTOMY WITH SENTINEL LYMPH NODE BIOPSY Right 01/31/2017   Procedure: BREAST LUMPECTOMY WITH SENTINEL LYMPH NODE BX;  Surgeon: Robert Bellow, MD;  Location: [facility] ORS;  Service: General;  Laterality: Right;  . CHOLECYSTECTOMY    . COLONOSCOPY    . ROTATOR CUFF REPAIR  2004    Family History  Problem Relation Age of Onset  . Breast cancer Sister 104  . Breast cancer Cousin   . Melanoma Brother   . Lupus Sister     Social History Social History   Tobacco Use  . Smoking status: Former Smoker    Packs/day: 0.25    Years: 29.00    Pack years: 7.25    Types: Cigarettes    Last attempt to quit: 1984    Years since quitting: 35.5  . Smokeless tobacco: Never  Used  Substance Use Topics  . Alcohol use: No  . Drug use: No    No Known Allergies  Current Outpatient Medications  Medication Sig Dispense Refill  . alendronate (FOSAMAX) 70 MG tablet Take 70 mg by mouth every Friday. Take with a full glass of water on an empty stomach.     Marland Kitchen aspirin EC 81 MG tablet Take 81 mg by mouth daily.    . Calcium Carb-Cholecalciferol (CALCIUM 600+D3 PO) Take 1 tablet by mouth 2 (two) times daily.    . cetirizine (ZYRTEC) 10 MG tablet Take 10 mg by mouth daily.    Marland Kitchen diltiazem (DILACOR XR) 240 MG 24 hr capsule Take 240 mg by mouth every morning.     . ergocalciferol (VITAMIN D2) 50000 units capsule Take 50,000 Units by mouth every Friday.     . esomeprazole (NEXIUM) 40 MG capsule Take 40 mg by mouth every morning.     . folic acid (FOLVITE) 1 MG tablet Take 1 mg by mouth daily.    . hydrochlorothiazide (HYDRODIURIL) 25 MG tablet Take 25 mg by mouth daily.    . methotrexate 2.5 MG tablet Take 12.5 mg by mouth 2 (two) times a week. Takes on Thursday and Friday    .  mirtazapine (REMERON) 15 MG tablet Take 15 mg by mouth at bedtime.    . Multiple Vitamin (MULTIVITAMIN WITH MINERALS) TABS tablet Take 1 tablet by mouth daily.    . potassium chloride SA (K-DUR,KLOR-CON) 20 MEQ tablet Take 20 mEq by mouth daily.     . tamoxifen (NOLVADEX) 20 MG tablet Take 1 tablet (20 mg total) by mouth daily. 30 tablet 12   No current facility-administered medications for this visit.     Review of Systems Review of Systems  Constitutional: Negative.   Respiratory: Negative.   Cardiovascular: Negative.     Blood pressure 120/72, pulse 68, resp. rate 12, height 5\' 2"  (1.575 m), weight 129 lb (58.5 kg).  Physical Exam Physical Exam  Constitutional: She is oriented to person, place, and time. She appears well-developed and well-nourished.  Eyes: Conjunctivae are normal. No scleral icterus.  Neck: Neck supple.  Cardiovascular: Normal rate, regular rhythm and normal heart  sounds.  Pulmonary/Chest: Effort normal and breath sounds normal. Right breast exhibits no inverted nipple, no mass, no nipple discharge, no skin change and no tenderness. Left breast exhibits no inverted nipple, no mass, no nipple discharge, no skin change and no tenderness.    Lymphadenopathy:    She has no cervical adenopathy.    She has no axillary adenopathy.  Neurological: She is alert and oriented to person, place, and time.  Skin: Skin is warm and dry.    Data Reviewed Bilateral diagnostic mammograms dated January 01, 2018 were reviewed.  BI-RADS-2.  Assessment    Stable breast exam.    Plan   Patient will be asked to return to the office in one year with a bilateral diagnotic mammogram. The patient is aware to call back for any questions or concerns.   HPI, Physical Exam, Assessment and Plan have been scribed under the direction and in the presence of Hervey Ard, MD.  Gaspar Cola, CMA  I have completed the exam and reviewed the above documentation for accuracy and completeness.  I agree with the above.  Haematologist has been used and any errors in dictation or transcription are unintentional.  Hervey Ard, M.D., F.A.C.S.   Kristina Medina 01/12/2018, 5:36 PM

## 2018-06-04 ENCOUNTER — Ambulatory Visit: Payer: PRIVATE HEALTH INSURANCE | Attending: Radiation Oncology | Admitting: Radiation Oncology

## 2018-07-04 ENCOUNTER — Encounter: Payer: Self-pay | Admitting: Radiation Oncology

## 2018-07-04 ENCOUNTER — Ambulatory Visit
Admission: RE | Admit: 2018-07-04 | Discharge: 2018-07-04 | Disposition: A | Discharge status: Home / Self Care | Payer: Medicare (Managed Care) | Source: Ambulatory Visit | Attending: Radiation Oncology | Admitting: Radiation Oncology

## 2018-07-04 ENCOUNTER — Other Ambulatory Visit: Payer: Self-pay

## 2018-07-04 VITALS — BP 168/77 | HR 60 | Temp 97.8°F | Resp 16 | Ht 62.0 in | Wt 130.4 lb

## 2018-07-04 DIAGNOSIS — Z170 Estrogen receptor positive status [ER+]: Secondary | ICD-10-CM | POA: Diagnosis not present

## 2018-07-04 DIAGNOSIS — Z923 Personal history of irradiation: Secondary | ICD-10-CM | POA: Insufficient documentation

## 2018-07-04 DIAGNOSIS — D0511 Intraductal carcinoma in situ of right breast: Secondary | ICD-10-CM | POA: Diagnosis not present

## 2018-07-04 DIAGNOSIS — Z79810 Long term (current) use of selective estrogen receptor modulators (SERMs): Secondary | ICD-10-CM | POA: Insufficient documentation

## 2018-07-04 NOTE — Progress Notes (Signed)
Radiation Oncology Follow up Note  Name: Kristina Medina   Date:   07/04/2018 MRN:  [id number] DOB: Oct 10, 1932    This 85 y.o. female presents to the clinic today for 1 year follow-up status post whole breast radiation to her right breast for ER/PR positive ductal carcinoma in situ.  REFERRING PROVIDER: Tracie Harrier, MD  HPI: patient is a 85 year old female now out 1 year having completed whole breast radiation to her right breast for stage 0 ER/PR positive ductal carcinoma in situ. Seen today in routine follow-up she is doing well. She specifically denies breast tenderness cough or bone pain.she is currently on tamoxifen tolerating that well without side effect.he had a mammogram back in July which I have reviewed was BI-RADS 2 benign.  COMPLICATIONS OF TREATMENT: none  FOLLOW UP COMPLIANCE: keeps appointments   PHYSICAL EXAM:  BP (!) 168/77 (BP Location: Left Arm, Patient Position: Sitting)   Pulse 60   Temp 97.8 F (36.6 C) (Tympanic)   Resp 16   Ht 5\' 2"  (1.575 m)   Wt 130 lb 6.4 oz (59.1 kg)   BMI 23.85 kg/m  Lungs are clear to A&P cardiac examination essentially unremarkable with regular rate and rhythm. No dominant mass or nodularity is noted in either breast in 2 positions examined. Incision is well-healed. No axillary or supraclavicular adenopathy is appreciated. Cosmetic result is fair with some retraction towards the scar around the nipple areolar complex. Well-developed well-nourished patient in NAD. HEENT reveals PERLA, EOMI, discs not visualized.  Oral cavity is clear. No oral mucosal lesions are identified. Neck is clear without evidence of cervical or supraclavicular adenopathy. Lungs are clear to A&P. Cardiac examination is essentially unremarkable with regular rate and rhythm without murmur rub or thrill. Abdomen is benign with no organomegaly or masses noted. Motor sensory and DTR levels are equal and symmetric in the upper and lower extremities. Cranial nerves II  through XII are grossly intact. Proprioception is intact. No peripheral adenopathy or edema is identified. No motor or sensory levels are noted. Crude visual fields are within normal range.  1RADIOLOGY RESULTS: mammograms are reviewed and compatible with the above-stated findings  PLAN: present time patient is doing well with no evidence of disease 1 year out I'm please were overall progress. I've asked to see her back in 1 year for follow-up. She continues on tamoxifen without side effect. Patient is to call with any concerns at any time.  I would like to take this opportunity to thank you for allowing me to participate in the care of your patient.Noreene Filbert, MD

## 2018-10-30 ENCOUNTER — Ambulatory Visit: Payer: PRIVATE HEALTH INSURANCE

## 2018-10-30 ENCOUNTER — Other Ambulatory Visit: Payer: Self-pay | Admitting: Family

## 2018-10-30 DIAGNOSIS — R600 Localized edema: Secondary | ICD-10-CM

## 2018-12-17 ENCOUNTER — Other Ambulatory Visit: Payer: Self-pay | Admitting: *Deleted

## 2018-12-17 DIAGNOSIS — D0511 Intraductal carcinoma in situ of right breast: Secondary | ICD-10-CM

## 2019-01-07 ENCOUNTER — Encounter: Payer: Self-pay | Admitting: General Surgery

## 2019-01-24 ENCOUNTER — Ambulatory Visit
Admission: RE | Admit: 2019-01-24 | Discharge: 2019-01-24 | Disposition: A | Discharge status: Home / Self Care | Payer: Medicare (Managed Care) | Source: Ambulatory Visit | Attending: General Surgery | Admitting: General Surgery

## 2019-01-24 ENCOUNTER — Other Ambulatory Visit: Payer: Self-pay | Admitting: Surgery

## 2019-01-24 ENCOUNTER — Other Ambulatory Visit: Payer: Self-pay | Admitting: General Surgery

## 2019-01-24 DIAGNOSIS — D0511 Intraductal carcinoma in situ of right breast: Secondary | ICD-10-CM

## 2019-01-31 ENCOUNTER — Ambulatory Visit: Payer: Medicare HMO | Admitting: General Surgery

## 2019-02-06 ENCOUNTER — Other Ambulatory Visit: Payer: Self-pay

## 2019-02-06 ENCOUNTER — Ambulatory Visit (INDEPENDENT_AMBULATORY_CARE_PROVIDER_SITE_OTHER): Payer: Medicare (Managed Care) | Admitting: Surgery

## 2019-02-06 ENCOUNTER — Encounter: Payer: Self-pay | Admitting: Surgery

## 2019-02-06 VITALS — BP 146/82 | HR 70 | Temp 97.3°F | Ht 62.0 in | Wt 122.0 lb

## 2019-02-06 DIAGNOSIS — D0511 Intraductal carcinoma in situ of right breast: Secondary | ICD-10-CM | POA: Diagnosis not present

## 2019-02-06 NOTE — Patient Instructions (Addendum)
The patient is aware to call back for any questions or new concerns.  The patient has been asked to return to the office in one year with a bilateral diagnostic mammogram.

## 2019-02-08 NOTE — Progress Notes (Signed)
Outpatient Surgical Follow Up  02/08/2019  Kristina Medina is an 85 y.o. female.   Chief Complaint  Patient presents with  . Follow-up     1 yr f/u rec bil Diag Mammo [facility], 01/24/19, D05.11    HPI: s/p lumpectomy by Dr.  Bary Castilla  01/2017. She did well. Recent mammo pers. Reviewed and d/w her no new concerning masses. She denies any masses, DC, no pain. No fevers or chills, no weight loss  Past Medical History:  Diagnosis Date  . Anemia   . Arthritis   . Breast cancer ([place]) 01/06/2017   right breast  . Cancer ([place]) 01/31/2017   right breast ER< 10 %; PR: Neg.Nodes negative x 4.  . Complication of anesthesia   . GERD (gastroesophageal reflux disease)   . History of kidney stones   . Hypertension   . Irregular heart beat   . Osteoporosis   . Patient is Jehovah's Witness   . Personal history of radiation therapy   . PONV (postoperative nausea and vomiting)    WITH GALLBLADDER SURGERY  . S/P right breast biopsy 1966    Past Surgical History:  Procedure Laterality Date  . ANKLE SURGERY Right 2005  . BREAST BIOPSY Right 01/06/2017   APOCRINE DUCTAL CARCINOMA IN SITU (DCIS),   . BREAST EXCISIONAL BIOPSY Right 1966   neg  . BREAST LUMPECTOMY Right 01/31/2017   Apocrine DCIS  . BREAST LUMPECTOMY WITH SENTINEL LYMPH NODE BIOPSY Right 01/31/2017   Procedure: BREAST LUMPECTOMY WITH SENTINEL LYMPH NODE BX;  Surgeon: Robert Bellow, MD;  Location: [facility] ORS;  Service: General;  Laterality: Right;  . CHOLECYSTECTOMY    . COLONOSCOPY    . ROTATOR CUFF REPAIR  2004    Family History  Problem Relation Age of Onset  . Breast cancer Sister 59  . Breast cancer Cousin   . Melanoma Brother   . Lupus Sister     Social History:  reports that she quit smoking about 36 years ago. Her smoking use included cigarettes. She has a 7.25 pack-year smoking history. She has never used smokeless tobacco. She reports that she does not drink alcohol or use drugs.  Allergies: No Known  Allergies  Medications reviewed.    ROS Full ROS performed and is otherwise negative other than what is stated in HPI   BP (!) 146/82   Pulse 70   Temp (!) 97.3 F (36.3 C) (Temporal)   Ht 5\' 2"  (1.575 m)   Wt 122 lb (55.3 kg)   SpO2 97%   BMI 22.31 kg/m   Physical Exam NAD, alert NECK: supple and no JVD BREAST: previous lumpectomy site scar. No new masses, nml nipple and skin , no discharge. No new masses Ext: warm and well perfused    Assessment/Plan:   Hx Ductal carcinoma in situ (DCIS) of right breast Nml mammo, repeat mammo and PE In 1 yr   Greater than 50% of the 25 minutes  visit was spent in counseling/coordination of care   Caroleen Hamman, MD [facility]

## 2019-02-21 ENCOUNTER — Telehealth: Payer: Self-pay

## 2019-02-21 NOTE — Telephone Encounter (Signed)
Patient notified per Dr Dahlia Byes that her ultrasound was normal. She is in recalls to follow up next year.

## 2019-07-10 ENCOUNTER — Ambulatory Visit
Admission: RE | Admit: 2019-07-10 | Discharge: 2019-07-10 | Disposition: A | Discharge status: Home / Self Care | Payer: Medicare (Managed Care) | Source: Ambulatory Visit | Attending: Radiation Oncology | Admitting: Radiation Oncology

## 2019-07-10 ENCOUNTER — Other Ambulatory Visit: Payer: Self-pay

## 2019-07-10 VITALS — BP 182/68 | HR 69 | Resp 16

## 2019-07-10 DIAGNOSIS — D0511 Intraductal carcinoma in situ of right breast: Secondary | ICD-10-CM | POA: Diagnosis present

## 2019-07-10 DIAGNOSIS — Z79810 Long term (current) use of selective estrogen receptor modulators (SERMs): Secondary | ICD-10-CM | POA: Insufficient documentation

## 2019-07-10 DIAGNOSIS — Z170 Estrogen receptor positive status [ER+]: Secondary | ICD-10-CM | POA: Diagnosis not present

## 2019-07-10 DIAGNOSIS — Z923 Personal history of irradiation: Secondary | ICD-10-CM | POA: Insufficient documentation

## 2019-07-10 NOTE — Progress Notes (Signed)
Radiation Oncology Follow up Note  Name: Kristina Medina   Date:   07/10/2019 MRN:  [id number] DOB: [birth year]    This 86 y.o. female presents to the clinic today for 2-year follow-up status whole breast radiation to her right breast for ER/PR positive ductal carcinoma in situ.  REFERRING PROVIDER: Tracie Harrier, MD  HPI: Patient is a 86 year old female now out need 2 years having completed whole breast radiation to her right breast for ER/PR positive ductal carcinoma in situ.  Seen today in routine follow-up she is doing well she occasionally has some slight tenderness still in the breast she does have retraction of her lumpectomy scar towards the nipple.  Otherwise she is doing well..  She had mammograms back in August which I have reviewed were BI-RADS 2 benign.  She is currently on tamoxifen tolerating that well without side effect.  COMPLICATIONS OF TREATMENT: none  FOLLOW UP COMPLIANCE: keeps appointments   PHYSICAL EXAM:  BP (!) 182/68 (BP Location: Left Arm, Patient Position: Sitting)   Pulse 69   Resp 16   Wt (P) 121 lb (54.9 kg)   BMI (P) 22.13 kg/m  Right breast is somewhat retracted towards the nipple.  No dominant mass or nodularity is noted in either breast in 2 positions examined.  No axillary or supraclavicular adenopathy is noted.  Well-developed well-nourished patient in NAD. HEENT reveals PERLA, EOMI, discs not visualized.  Oral cavity is clear. No oral mucosal lesions are identified. Neck is clear without evidence of cervical or supraclavicular adenopathy. Lungs are clear to A&P. Cardiac examination is essentially unremarkable with regular rate and rhythm without murmur rub or thrill. Abdomen is benign with no organomegaly or masses noted. Motor sensory and DTR levels are equal and symmetric in the upper and lower extremities. Cranial nerves II through XII are grossly intact. Proprioception is intact. No peripheral adenopathy or edema is identified. No motor or  sensory levels are noted. Crude visual fields are within normal range.  RADIOLOGY RESULTS: Mammograms reviewed compatible with above-stated findings  PLAN: Present time patient is doing well with no evidence of disease 2 years out.  She continues on tamoxifen without side effect.  I have asked to see her back in 1 year for follow-up.  Patient is to call with any concerns at any time.  I would like to take this opportunity to thank you for allowing me to participate in the care of your patient.Noreene Filbert, MD

## 2019-08-13 ENCOUNTER — Other Ambulatory Visit: Payer: Self-pay

## 2019-08-13 ENCOUNTER — Other Ambulatory Visit
Admission: RE | Admit: 2019-08-13 | Discharge: 2019-08-13 | Disposition: A | Discharge status: Home / Self Care | Payer: Medicare (Managed Care) | Source: Ambulatory Visit | Attending: Ophthalmology | Admitting: Ophthalmology

## 2019-08-13 DIAGNOSIS — Z01812 Encounter for preprocedural laboratory examination: Secondary | ICD-10-CM | POA: Diagnosis present

## 2019-08-13 DIAGNOSIS — Z20822 Contact with and (suspected) exposure to covid-19: Secondary | ICD-10-CM | POA: Diagnosis not present

## 2019-08-13 LAB — SARS CORONAVIRUS 2 (TAT 6-24 HRS): SARS Coronavirus 2: NEGATIVE

## 2019-08-14 NOTE — Discharge Instructions (Signed)

## 2019-08-15 ENCOUNTER — Ambulatory Visit: Payer: Medicare (Managed Care) | Admitting: Anesthesiology

## 2019-08-15 ENCOUNTER — Encounter
Admission: RE | Disposition: A | Discharge status: Home / Self Care | Payer: Self-pay | Source: Home / Self Care | Attending: Ophthalmology

## 2019-08-15 ENCOUNTER — Other Ambulatory Visit: Payer: Self-pay

## 2019-08-15 ENCOUNTER — Ambulatory Visit
Admission: RE | Admit: 2019-08-15 | Discharge: 2019-08-15 | Disposition: A | Discharge status: Home / Self Care | Payer: Medicare (Managed Care) | Attending: Ophthalmology | Admitting: Ophthalmology

## 2019-08-15 DIAGNOSIS — Z9049 Acquired absence of other specified parts of digestive tract: Secondary | ICD-10-CM | POA: Diagnosis not present

## 2019-08-15 DIAGNOSIS — H2511 Age-related nuclear cataract, right eye: Secondary | ICD-10-CM | POA: Diagnosis present

## 2019-08-15 DIAGNOSIS — I10 Essential (primary) hypertension: Secondary | ICD-10-CM | POA: Diagnosis not present

## 2019-08-15 DIAGNOSIS — K219 Gastro-esophageal reflux disease without esophagitis: Secondary | ICD-10-CM | POA: Diagnosis not present

## 2019-08-15 DIAGNOSIS — K5790 Diverticulosis of intestine, part unspecified, without perforation or abscess without bleeding: Secondary | ICD-10-CM | POA: Diagnosis not present

## 2019-08-15 DIAGNOSIS — Z853 Personal history of malignant neoplasm of breast: Secondary | ICD-10-CM | POA: Insufficient documentation

## 2019-08-15 DIAGNOSIS — M810 Age-related osteoporosis without current pathological fracture: Secondary | ICD-10-CM | POA: Diagnosis not present

## 2019-08-15 DIAGNOSIS — Z87891 Personal history of nicotine dependence: Secondary | ICD-10-CM | POA: Diagnosis not present

## 2019-08-15 DIAGNOSIS — G4700 Insomnia, unspecified: Secondary | ICD-10-CM | POA: Insufficient documentation

## 2019-08-15 HISTORY — PX: CATARACT EXTRACTION W/PHACO: SHX586

## 2019-08-15 SURGERY — PHACOEMULSIFICATION, CATARACT, WITH IOL INSERTION
Anesthesia: Monitor Anesthesia Care | Site: Eye | Laterality: Right

## 2019-08-15 MED ORDER — LIDOCAINE HCL (PF) 2 % IJ SOLN
INTRAOCULAR | Status: DC | PRN
  Administered 2019-08-15: 1 mL via INTRAOCULAR

## 2019-08-15 MED ORDER — ONDANSETRON HCL 4 MG/2ML IJ SOLN
INTRAMUSCULAR | Status: DC | PRN
  Administered 2019-08-15: 4 mg via INTRAVENOUS

## 2019-08-15 MED ORDER — MOXIFLOXACIN HCL 0.5 % OP SOLN
OPHTHALMIC | Status: DC | PRN
  Administered 2019-08-15: 0.2 mL via OPHTHALMIC

## 2019-08-15 MED ORDER — BRIMONIDINE TARTRATE-TIMOLOL 0.2-0.5 % OP SOLN
OPHTHALMIC | Status: DC | PRN
  Administered 2019-08-15: 1 [drp] via OPHTHALMIC

## 2019-08-15 MED ORDER — TETRACAINE HCL 0.5 % OP SOLN
1.0000 [drp] | OPHTHALMIC | Status: DC | PRN
  Administered 2019-08-15 (×3): 1 [drp] via OPHTHALMIC

## 2019-08-15 MED ORDER — PROVISC 10 MG/ML IO SOLN
INTRAOCULAR | Status: DC | PRN
  Administered 2019-08-15: 0.55 mL via INTRAOCULAR

## 2019-08-15 MED ORDER — NA CHONDROIT SULF-NA HYALURON 40-17 MG/ML IO SOLN
INTRAOCULAR | Status: DC | PRN
  Administered 2019-08-15: 1 mL via INTRAOCULAR

## 2019-08-15 MED ORDER — ACETAMINOPHEN 325 MG PO TABS: 325.0000 mg | ORAL_TABLET | Freq: Once | ORAL | Status: DC

## 2019-08-15 MED ORDER — LACTATED RINGERS IV SOLN: 10.0000 mL/h | INTRAVENOUS | Status: DC

## 2019-08-15 MED ORDER — EPINEPHRINE PF 1 MG/ML IJ SOLN
INTRAOCULAR | Status: DC | PRN
  Administered 2019-08-15: 79 mL via OPHTHALMIC

## 2019-08-15 MED ORDER — ARMC OPHTHALMIC DILATING DROPS
1.0000 "application " | OPHTHALMIC | Status: DC | PRN
  Administered 2019-08-15 (×3): 1 via OPHTHALMIC

## 2019-08-15 MED ORDER — FENTANYL CITRATE (PF) 100 MCG/2ML IJ SOLN
INTRAMUSCULAR | Status: DC | PRN
  Administered 2019-08-15 (×2): 50 ug via INTRAVENOUS

## 2019-08-15 MED ORDER — NEOMYCIN-POLYMYXIN-DEXAMETH 3.5-10000-0.1 OP OINT
TOPICAL_OINTMENT | OPHTHALMIC | Status: DC | PRN
  Administered 2019-08-15: 1 via OPHTHALMIC

## 2019-08-15 MED ORDER — ACETAMINOPHEN 160 MG/5ML PO SOLN: 325.0000 mg | Freq: Once | ORAL | Status: DC

## 2019-08-15 MED ORDER — TRYPAN BLUE 0.06 % OP SOLN
OPHTHALMIC | Status: DC | PRN
  Administered 2019-08-15: 0.5 mL via INTRAOCULAR

## 2019-08-15 MED ORDER — DEXMEDETOMIDINE HCL 200 MCG/2ML IV SOLN
INTRAVENOUS | Status: DC | PRN
  Administered 2019-08-15: 10 ug via INTRAVENOUS

## 2019-08-15 MED ORDER — TETRACAINE 0.5 % OP SOLN OPTIME - NO CHARGE
OPHTHALMIC | Status: DC | PRN
  Administered 2019-08-15: 2 [drp] via OPHTHALMIC

## 2019-08-15 SURGICAL SUPPLY — 17 items
DISSECTOR HYDRO NUCLEUS 50X22 (MISCELLANEOUS) ×12 IMPLANT
DRSG TEGADERM 2-3/8X2-3/4 SM (GAUZE/BANDAGES/DRESSINGS) ×3 IMPLANT
GLOVE BIOGEL PI IND STRL 8 (GLOVE) ×1 IMPLANT
GLOVE BIOGEL PI INDICATOR 8 (GLOVE) ×2
GOWN STRL REUS W/ TWL LRG LVL3 (GOWN DISPOSABLE) ×1 IMPLANT
GOWN STRL REUS W/ TWL XL LVL3 (GOWN DISPOSABLE) ×1 IMPLANT
GOWN STRL REUS W/TWL LRG LVL3 (GOWN DISPOSABLE) ×2
GOWN STRL REUS W/TWL XL LVL3 (GOWN DISPOSABLE) ×2
KNIFE 45D UP 2.3 (MISCELLANEOUS) ×3 IMPLANT
LENS IOL TECNIS ITEC 24.5 (Intraocular Lens) ×3 IMPLANT
PACK CATARACT (MISCELLANEOUS) ×3 IMPLANT
PACK DR. KING ARMS (PACKS) ×3 IMPLANT
PACK EYE AFTER SURG (MISCELLANEOUS) ×3 IMPLANT
SOLUTION OPHTHALMIC SALT (MISCELLANEOUS) ×3 IMPLANT
SPONGE SURG I SPEAR (MISCELLANEOUS) ×9 IMPLANT
WATER STERILE IRR 250ML POUR (IV SOLUTION) ×3 IMPLANT
WIPE NON LINTING 3.25X3.25 (MISCELLANEOUS) ×3 IMPLANT

## 2019-08-15 NOTE — H&P (Signed)
   I have reviewed the patient's H&P and agree with its findings. There have been no interval changes.  Paul Torpey MD Ophthalmology 

## 2019-08-15 NOTE — Anesthesia Procedure Notes (Signed)
Procedure Name: MAC Date/Time: 08/15/2019 9:52 AM Performed by: Vanetta Shawl, CRNA Pre-anesthesia Checklist: Patient identified, Emergency Drugs available, Suction available, Timeout performed and Patient being monitored Patient Re-evaluated:Patient Re-evaluated prior to induction Oxygen Delivery Method: Nasal cannula Placement Confirmation: positive ETCO2

## 2019-08-15 NOTE — Transfer of Care (Signed)
Immediate Anesthesia Transfer of Care Note  Patient: Kristina Medina  Procedure(s) Performed: CATARACT EXTRACTION PHACO AND INTRAOCULAR LENS PLACEMENT (IOC) RIGHT CDE:  9.44, Total U/S Time:  01:00.5, FP3:  15.4% (Right Eye)  Patient Location: PACU  Anesthesia Type: MAC  Level of Consciousness: awake, alert  and patient cooperative  Airway and Oxygen Therapy: Patient Spontanous Breathing  Post-op Assessment: Post-op Vital signs reviewed, Patient's Cardiovascular Status Stable, Respiratory Function Stable, Patent Airway and No signs of Nausea or vomiting  Post-op Vital Signs: Reviewed and stable  Complications: No apparent anesthesia complications

## 2019-08-15 NOTE — Anesthesia Postprocedure Evaluation (Signed)
Anesthesia Post Note  Patient: Kristina Medina  Procedure(s) Performed: CATARACT EXTRACTION PHACO AND INTRAOCULAR LENS PLACEMENT (IOC) RIGHT CDE:  9.44, Total U/S Time:  01:00.5, FP3:  15.4% (Right Eye)     Patient location during evaluation: PACU Anesthesia Type: MAC Level of consciousness: awake and alert and oriented Pain management: satisfactory to patient Vital Signs Assessment: post-procedure vital signs reviewed and stable Respiratory status: spontaneous breathing, nonlabored ventilation and respiratory function stable Cardiovascular status: blood pressure returned to baseline and stable Postop Assessment: Adequate PO intake and No signs of nausea or vomiting Anesthetic complications: no    Raliegh Ip

## 2019-08-15 NOTE — Anesthesia Preprocedure Evaluation (Signed)
Anesthesia Evaluation  Patient identified by MRN, date of birth, ID band Patient awake    Reviewed: Allergy & Precautions, H&P , NPO status , Patient's Chart, lab work & pertinent test results  History of Anesthesia Complications (+) PONV and history of anesthetic complications  Airway Mallampati: II  TM Distance: >3 FB Neck ROM: full    Dental no notable dental hx. (+) Partial Lower, Partial Upper   Pulmonary former smoker,    Pulmonary exam normal breath sounds clear to auscultation       Cardiovascular hypertension, Normal cardiovascular exam Rhythm:regular Rate:Normal     Neuro/Psych    GI/Hepatic GERD  ,  Endo/Other    Renal/GU      Musculoskeletal   Abdominal   Peds  Hematology   Anesthesia Other Findings   Reproductive/Obstetrics                             Anesthesia Physical Anesthesia Plan  ASA: II  Anesthesia Plan: MAC   Post-op Pain Management:    Induction:   PONV Risk Score and Plan: 3 and Treatment may vary due to age or medical condition, TIVA and Midazolam  Airway Management Planned:   Additional Equipment:   Intra-op Plan:   Post-operative Plan:   Informed Consent: I have reviewed the patients History and Physical, chart, labs and discussed the procedure including the risks, benefits and alternatives for the proposed anesthesia with the patient or authorized representative who has indicated his/her understanding and acceptance.     Dental Advisory Given  Plan Discussed with: CRNA  Anesthesia Plan Comments:         Anesthesia Quick Evaluation

## 2019-08-15 NOTE — Op Note (Signed)
  PREOPERATIVE DIAGNOSIS:  Nuclear sclerotic cataract of the RIGHT eye.   POSTOPERATIVE DIAGNOSIS:  Nuclear sclerotic cataract of the RIGHT eye.   OPERATIVE PROCEDURE: Cataract surgery OD   SURGEON:  Marchia Meiers, MD.   ANESTHESIA:  Anesthesiologist: Ronelle Nigh, MD CRNA: Vanetta Shawl, CRNA  1.      Managed anesthesia care. 2.     0.78ml of Shugarcaine was instilled following the paracentesis   COMPLICATIONS:  None.   TECHNIQUE:   Divide and conquer   DESCRIPTION OF PROCEDURE:  The patient was examined and consented in the preoperative holding area where the aforementioned topical anesthesia was applied to the RIGHT eye and then brought back to the Operating Room where the RIGHT eye was prepped and draped in the usual sterile ophthalmic fashion and a lid speculum was placed. A paracentesis was created with the side port blade, the anterior chamber was washed out with trypan blue to stain the anterior capsule, and the anterior chamber was filled with viscoelastic. A near clear corneal incision was performed with the steel keratome. A continuous curvilinear capsulorrhexis was performed with a cystotome followed by the capsulorrhexis forceps. Hydrodissection and hydrodelineation were carried out with BSS on a blunt cannula. The lens was removed in a divide and conquer  technique and the remaining cortical material was removed with the irrigation-aspiration handpiece. The capsular bag was inflated with viscoelastic and the lens was placed in the capsular bag without complication. The remaining viscoelastic was removed from the eye with the irrigation-aspiration handpiece. The wounds were hydrated. The anterior chamber was flushed and the eye was inflated to physiologic pressure. 0.46ml Vigamox was placed in the anterior chamber. The wounds were found to be water tight. The eye was dressed with Vigamox. The patient was given protective glasses to wear throughout the day and a shield with which  to sleep tonight. The patient was also given drops with which to begin a drop regimen today and will follow-up with me in one day. Implant Name Type Inv. Item Serial No. Manufacturer Lot No. LRB No. Used Action  LENS IOL DIOP 24.5 - [id number] Intraocular Lens LENS IOL DIOP 24.5 [id number] AMO  Right 1 Implanted    Procedure(s): CATARACT EXTRACTION PHACO AND INTRAOCULAR LENS PLACEMENT (IOC) RIGHT CDE:  9.44, Total U/S Time:  01:00.5, FP3:  15.4% (Right)  Electronically signed: Marchia Meiers 08/15/2019 10:34 AM

## 2019-08-16 ENCOUNTER — Encounter: Payer: Self-pay | Admitting: *Deleted

## 2019-09-12 ENCOUNTER — Encounter: Payer: Self-pay | Admitting: Ophthalmology

## 2019-09-12 ENCOUNTER — Other Ambulatory Visit: Payer: Self-pay

## 2019-09-19 DIAGNOSIS — H269 Unspecified cataract: Secondary | ICD-10-CM

## 2019-09-19 HISTORY — DX: Unspecified cataract: H26.9

## 2019-09-19 NOTE — Discharge Instructions (Signed)

## 2019-09-20 ENCOUNTER — Other Ambulatory Visit: Payer: Self-pay

## 2019-09-20 ENCOUNTER — Other Ambulatory Visit: Payer: Medicare (Managed Care)

## 2019-09-20 ENCOUNTER — Other Ambulatory Visit
Admission: RE | Admit: 2019-09-20 | Discharge: 2019-09-20 | Disposition: A | Discharge status: Home / Self Care | Payer: Medicare (Managed Care) | Source: Ambulatory Visit | Attending: Ophthalmology | Admitting: Ophthalmology

## 2019-09-20 DIAGNOSIS — Z20822 Contact with and (suspected) exposure to covid-19: Secondary | ICD-10-CM | POA: Diagnosis not present

## 2019-09-20 DIAGNOSIS — Z01812 Encounter for preprocedural laboratory examination: Secondary | ICD-10-CM | POA: Diagnosis present

## 2019-09-21 LAB — SARS CORONAVIRUS 2 (TAT 6-24 HRS): SARS Coronavirus 2: NEGATIVE

## 2019-09-23 ENCOUNTER — Other Ambulatory Visit: Payer: Medicare (Managed Care)

## 2019-09-24 ENCOUNTER — Ambulatory Visit
Admission: RE | Admit: 2019-09-24 | Discharge: 2019-09-24 | Disposition: A | Discharge status: Home / Self Care | Payer: Medicare (Managed Care) | Attending: Ophthalmology | Admitting: Ophthalmology

## 2019-09-24 ENCOUNTER — Ambulatory Visit: Payer: Medicare (Managed Care) | Admitting: Anesthesiology

## 2019-09-24 ENCOUNTER — Encounter
Admission: RE | Disposition: A | Discharge status: Home / Self Care | Payer: Self-pay | Source: Home / Self Care | Attending: Ophthalmology

## 2019-09-24 ENCOUNTER — Encounter: Payer: Self-pay | Admitting: Ophthalmology

## 2019-09-24 ENCOUNTER — Other Ambulatory Visit: Payer: Self-pay

## 2019-09-24 DIAGNOSIS — Z79810 Long term (current) use of selective estrogen receptor modulators (SERMs): Secondary | ICD-10-CM | POA: Insufficient documentation

## 2019-09-24 DIAGNOSIS — Z853 Personal history of malignant neoplasm of breast: Secondary | ICD-10-CM | POA: Diagnosis not present

## 2019-09-24 DIAGNOSIS — I10 Essential (primary) hypertension: Secondary | ICD-10-CM | POA: Diagnosis not present

## 2019-09-24 DIAGNOSIS — Z87891 Personal history of nicotine dependence: Secondary | ICD-10-CM | POA: Insufficient documentation

## 2019-09-24 DIAGNOSIS — M1990 Unspecified osteoarthritis, unspecified site: Secondary | ICD-10-CM | POA: Diagnosis not present

## 2019-09-24 DIAGNOSIS — Z79899 Other long term (current) drug therapy: Secondary | ICD-10-CM | POA: Insufficient documentation

## 2019-09-24 DIAGNOSIS — Z7982 Long term (current) use of aspirin: Secondary | ICD-10-CM | POA: Diagnosis not present

## 2019-09-24 DIAGNOSIS — H2512 Age-related nuclear cataract, left eye: Secondary | ICD-10-CM | POA: Diagnosis present

## 2019-09-24 DIAGNOSIS — K219 Gastro-esophageal reflux disease without esophagitis: Secondary | ICD-10-CM | POA: Diagnosis not present

## 2019-09-24 HISTORY — PX: CATARACT EXTRACTION W/PHACO: SHX586

## 2019-09-24 SURGERY — PHACOEMULSIFICATION, CATARACT, WITH IOL INSERTION
Anesthesia: Monitor Anesthesia Care | Site: Eye | Laterality: Left

## 2019-09-24 MED ORDER — ACETAMINOPHEN 160 MG/5ML PO SOLN: 325.0000 mg | Freq: Once | ORAL | Status: DC

## 2019-09-24 MED ORDER — NA CHONDROIT SULF-NA HYALURON 40-17 MG/ML IO SOLN
INTRAOCULAR | Status: DC | PRN
  Administered 2019-09-24: 1 mL via INTRAOCULAR

## 2019-09-24 MED ORDER — ONDANSETRON HCL 4 MG/2ML IJ SOLN
INTRAMUSCULAR | Status: DC | PRN
  Administered 2019-09-24: 4 mg via INTRAVENOUS

## 2019-09-24 MED ORDER — PROVISC 10 MG/ML IO SOLN
INTRAOCULAR | Status: DC | PRN
  Administered 2019-09-24: 0.55 mL via INTRAOCULAR

## 2019-09-24 MED ORDER — FENTANYL CITRATE (PF) 100 MCG/2ML IJ SOLN
INTRAMUSCULAR | Status: DC | PRN
  Administered 2019-09-24 (×2): 50 ug via INTRAVENOUS

## 2019-09-24 MED ORDER — ACETAMINOPHEN 325 MG PO TABS: 325.0000 mg | ORAL_TABLET | Freq: Once | ORAL | Status: DC

## 2019-09-24 MED ORDER — MOXIFLOXACIN HCL 0.5 % OP SOLN
OPHTHALMIC | Status: DC | PRN
  Administered 2019-09-24: 0.2 mL via OPHTHALMIC

## 2019-09-24 MED ORDER — TETRACAINE HCL 0.5 % OP SOLN
1.0000 [drp] | OPHTHALMIC | Status: DC | PRN
  Administered 2019-09-24 (×3): 1 [drp] via OPHTHALMIC

## 2019-09-24 MED ORDER — TRYPAN BLUE 0.06 % OP SOLN
OPHTHALMIC | Status: DC | PRN
  Administered 2019-09-24: 0.5 mL via INTRAOCULAR

## 2019-09-24 MED ORDER — EPINEPHRINE PF 1 MG/ML IJ SOLN
INTRAOCULAR | Status: DC | PRN
  Administered 2019-09-24: 87 mL via OPHTHALMIC

## 2019-09-24 MED ORDER — TETRACAINE 0.5 % OP SOLN OPTIME - NO CHARGE
OPHTHALMIC | Status: DC | PRN
  Administered 2019-09-24: 10:00:00 2 [drp] via OPHTHALMIC

## 2019-09-24 MED ORDER — BRIMONIDINE TARTRATE-TIMOLOL 0.2-0.5 % OP SOLN
OPHTHALMIC | Status: DC | PRN
  Administered 2019-09-24: 1 [drp] via OPHTHALMIC

## 2019-09-24 MED ORDER — DEXMEDETOMIDINE HCL 200 MCG/2ML IV SOLN
INTRAVENOUS | Status: DC | PRN
  Administered 2019-09-24: 10 ug via INTRAVENOUS

## 2019-09-24 MED ORDER — LIDOCAINE HCL (PF) 2 % IJ SOLN
INTRAOCULAR | Status: DC | PRN
  Administered 2019-09-24: 10:00:00 1 mL via INTRAOCULAR

## 2019-09-24 MED ORDER — ARMC OPHTHALMIC DILATING DROPS
1.0000 "application " | OPHTHALMIC | Status: DC | PRN
  Administered 2019-09-24 (×3): 1 via OPHTHALMIC

## 2019-09-24 SURGICAL SUPPLY — 18 items
DISSECTOR HYDRO NUCLEUS 50X22 (MISCELLANEOUS) ×12 IMPLANT
DRSG TEGADERM 2-3/8X2-3/4 SM (GAUZE/BANDAGES/DRESSINGS) ×3 IMPLANT
GLOVE BIOGEL PI IND STRL 8 (GLOVE) ×1 IMPLANT
GLOVE BIOGEL PI INDICATOR 8 (GLOVE) ×2
GLOVE PROTEXIS LATEX SZ 7.5 (GLOVE) ×3 IMPLANT
GLOVE SURG LATEX 7.5 PF (GLOVE) IMPLANT
GOWN STRL REUS W/ TWL LRG LVL3 (GOWN DISPOSABLE) ×1 IMPLANT
GOWN STRL REUS W/ TWL XL LVL3 (GOWN DISPOSABLE) ×1 IMPLANT
GOWN STRL REUS W/TWL LRG LVL3 (GOWN DISPOSABLE) ×2
GOWN STRL REUS W/TWL XL LVL3 (GOWN DISPOSABLE) ×2
KNIFE 45D UP 2.3 (MISCELLANEOUS) ×3 IMPLANT
LENS IOL TECNIS ITEC 25.0 (Intraocular Lens) ×2 IMPLANT
PACK CATARACT (MISCELLANEOUS) ×3 IMPLANT
PACK DR. KING ARMS (PACKS) ×3 IMPLANT
PACK EYE AFTER SURG (MISCELLANEOUS) ×3 IMPLANT
SOLUTION OPHTHALMIC SALT (MISCELLANEOUS) ×3 IMPLANT
WATER STERILE IRR 250ML POUR (IV SOLUTION) ×3 IMPLANT
WIPE NON LINTING 3.25X3.25 (MISCELLANEOUS) ×3 IMPLANT

## 2019-09-24 NOTE — Transfer of Care (Signed)
Immediate Anesthesia Transfer of Care Note  Patient: Kristina Medina  Procedure(s) Performed: CATARACT EXTRACTION PHACO AND INTRAOCULAR LENS PLACEMENT (IOC) LEFT DIABETIC (Left Eye)  Patient Location: PACU  Anesthesia Type: MAC  Level of Consciousness: awake, alert  and patient cooperative  Airway and Oxygen Therapy: Patient Spontanous Breathing   Post-op Assessment: Post-op Vital signs reviewed, Patient's Cardiovascular Status Stable, Respiratory Function Stable, Patent Airway and No signs of Nausea or vomiting  Post-op Vital Signs: Reviewed and stable  Complications: No apparent anesthesia complications

## 2019-09-24 NOTE — Anesthesia Preprocedure Evaluation (Signed)
Anesthesia Evaluation  Patient identified by MRN, date of birth, ID band Patient awake    Reviewed: Allergy & Precautions, H&P , NPO status , Patient's Chart, lab work & pertinent test results  History of Anesthesia Complications (+) PONV and history of anesthetic complications  Airway Mallampati: II  TM Distance: >3 FB Neck ROM: full    Dental no notable dental hx. (+) Partial Lower, Partial Upper   Pulmonary former smoker,    Pulmonary exam normal breath sounds clear to auscultation       Cardiovascular hypertension, Normal cardiovascular exam Rhythm:regular Rate:Normal     Neuro/Psych    GI/Hepatic GERD  ,  Endo/Other    Renal/GU      Musculoskeletal   Abdominal   Peds  Hematology   Anesthesia Other Findings   Reproductive/Obstetrics                             Anesthesia Physical  Anesthesia Plan  ASA: II  Anesthesia Plan: MAC   Post-op Pain Management:    Induction:   PONV Risk Score and Plan: 3 and Treatment may vary due to age or medical condition, TIVA and Midazolam  Airway Management Planned:   Additional Equipment:   Intra-op Plan:   Post-operative Plan:   Informed Consent: I have reviewed the patients History and Physical, chart, labs and discussed the procedure including the risks, benefits and alternatives for the proposed anesthesia with the patient or authorized representative who has indicated his/her understanding and acceptance.     Dental Advisory Given  Plan Discussed with: CRNA  Anesthesia Plan Comments:         Anesthesia Quick Evaluation

## 2019-09-24 NOTE — Anesthesia Procedure Notes (Signed)
Procedure Name: MAC Date/Time: 09/24/2019 10:02 AM Performed by: Vanetta Shawl, CRNA Pre-anesthesia Checklist: Patient identified, Emergency Drugs available, Suction available, Timeout performed and Patient being monitored Patient Re-evaluated:Patient Re-evaluated prior to induction Oxygen Delivery Method: Nasal cannula Placement Confirmation: positive ETCO2

## 2019-09-24 NOTE — Anesthesia Postprocedure Evaluation (Signed)
Anesthesia Post Note  Patient: Kristina Medina  Procedure(s) Performed: CATARACT EXTRACTION PHACO AND INTRAOCULAR LENS PLACEMENT (IOC) LEFT DIABETIC (Left Eye)     Patient location during evaluation: PACU Anesthesia Type: MAC Level of consciousness: awake and alert and oriented Pain management: satisfactory to patient Vital Signs Assessment: post-procedure vital signs reviewed and stable Respiratory status: spontaneous breathing, nonlabored ventilation and respiratory function stable Cardiovascular status: blood pressure returned to baseline and stable Postop Assessment: Adequate PO intake and No signs of nausea or vomiting Anesthetic complications: no    Raliegh Ip

## 2019-09-24 NOTE — H&P (Signed)
   I have reviewed the patient's H&P and agree with its findings. There have been no interval changes.  Suhani Stillion MD Ophthalmology 

## 2019-09-24 NOTE — Op Note (Signed)
PREOPERATIVE DIAGNOSIS:  Nuclear sclerotic cataract of the LEFT eye.   POSTOPERATIVE DIAGNOSIS:  Nuclear sclerotic cataract of the LEFT eye.   OPERATIVE PROCEDURE: Cataract surgery OS   SURGEON:  Marchia Meiers, MD.   ANESTHESIA:  Anesthesiologist: Ronelle Nigh, MD CRNA: Vanetta Shawl, CRNA  1.      Managed anesthesia care. 2.     0.19ml of Shugarcaine was instilled following the paracentesis   COMPLICATIONS:  None.   TECHNIQUE:   Divide and conquer   DESCRIPTION OF PROCEDURE:  The patient was examined and consented in the preoperative holding area where the aforementioned topical anesthesia was applied to the LEFT eye and then brought back to the Operating Room where the left eye was prepped and draped in the usual sterile ophthalmic fashion and a lid speculum was placed. A paracentesis was created with the side port blade, the anterior chamber was washed out with trypan blue to stain the anterior capsule, and the anterior chamber was filled with viscoelastic. A near clear corneal incision was performed with the steel keratome. A continuous curvilinear capsulorrhexis was performed with a cystotome followed by the capsulorrhexis forceps. Hydrodissection and hydrodelineation were carried out with BSS on a blunt cannula. The lens was removed in a divide and conquer  technique and the remaining cortical material was removed with the irrigation-aspiration handpiece. The capsular bag was inflated with viscoelastic and the lens was placed in the capsular bag without complication. The remaining viscoelastic was removed from the eye with the irrigation-aspiration handpiece. The wounds were hydrated. The anterior chamber was flushed and the eye was inflated to physiologic pressure. 0.37ml Vigamox was placed in the anterior chamber. The wounds were found to be water tight. The eye was dressed with Vigamox. The patient was given protective glasses to wear throughout the day and a shield with which to  sleep tonight. The patient was also given drops with which to begin a drop regimen today and will follow-up with me in one day. Implant Name Type Inv. Item Serial No. Manufacturer Lot No. LRB No. Used Action  LENS IOL DIOP 25.0 - [id number] Intraocular Lens LENS IOL DIOP 25.0 [id number]  Left 1 Implanted    Procedure(s): CATARACT EXTRACTION PHACO AND INTRAOCULAR LENS PLACEMENT (IOC) LEFT DIABETIC (Left)  Electronically signed: Marchia Meiers 09/24/2019 10:39 AM

## 2019-09-25 ENCOUNTER — Encounter: Payer: Self-pay | Admitting: *Deleted

## 2019-10-03 ENCOUNTER — Other Ambulatory Visit: Payer: Self-pay | Admitting: Family

## 2019-10-03 DIAGNOSIS — I129 Hypertensive chronic kidney disease with stage 1 through stage 4 chronic kidney disease, or unspecified chronic kidney disease: Secondary | ICD-10-CM

## 2019-10-09 ENCOUNTER — Ambulatory Visit: Payer: Medicare (Managed Care)

## 2019-10-10 ENCOUNTER — Ambulatory Visit: Payer: Medicare (Managed Care)

## 2019-10-16 ENCOUNTER — Ambulatory Visit
Admission: RE | Admit: 2019-10-16 | Discharge: 2019-10-16 | Disposition: A | Discharge status: Home / Self Care | Payer: Medicare (Managed Care) | Source: Ambulatory Visit | Attending: Family | Admitting: Family

## 2019-10-16 ENCOUNTER — Other Ambulatory Visit: Payer: Self-pay

## 2019-10-16 DIAGNOSIS — I129 Hypertensive chronic kidney disease with stage 1 through stage 4 chronic kidney disease, or unspecified chronic kidney disease: Secondary | ICD-10-CM | POA: Insufficient documentation

## 2019-12-05 ENCOUNTER — Other Ambulatory Visit: Payer: Self-pay

## 2019-12-05 DIAGNOSIS — D0511 Intraductal carcinoma in situ of right breast: Secondary | ICD-10-CM

## 2020-01-28 ENCOUNTER — Other Ambulatory Visit: Payer: Medicare (Managed Care)

## 2020-02-12 ENCOUNTER — Ambulatory Visit: Payer: Medicare (Managed Care) | Admitting: Surgery

## 2020-02-21 ENCOUNTER — Ambulatory Visit
Admission: RE | Admit: 2020-02-21 | Discharge: 2020-02-21 | Disposition: A | Discharge status: Home / Self Care | Payer: Medicare (Managed Care) | Source: Ambulatory Visit | Attending: Surgery | Admitting: Surgery

## 2020-02-21 ENCOUNTER — Other Ambulatory Visit: Payer: Self-pay

## 2020-02-21 DIAGNOSIS — D0511 Intraductal carcinoma in situ of right breast: Secondary | ICD-10-CM | POA: Diagnosis not present

## 2020-02-25 ENCOUNTER — Telehealth: Payer: Self-pay

## 2020-02-25 NOTE — Telephone Encounter (Signed)
Left detailed message for patient to notify her of recent normal mammogram results. Advised patient to give our office a call back if she has any questions or concerns.

## 2020-02-25 NOTE — Telephone Encounter (Signed)
-----   Message from Jules Husbands, MD sent at 02/25/2020 10:40 AM EDT ----- Please let her know mammo was nml ----- Message ----- From: Interface, Rad Results In Sent: 02/21/2020   3:44 PM EDT To: Jules Husbands, MD

## 2020-03-02 ENCOUNTER — Ambulatory Visit (INDEPENDENT_AMBULATORY_CARE_PROVIDER_SITE_OTHER): Payer: Medicare (Managed Care) | Admitting: Surgery

## 2020-03-02 ENCOUNTER — Encounter: Payer: Self-pay | Admitting: Surgery

## 2020-03-02 ENCOUNTER — Other Ambulatory Visit: Payer: Self-pay

## 2020-03-02 VITALS — BP 155/78 | HR 72 | Temp 97.2°F | Ht 63.0 in | Wt 114.0 lb

## 2020-03-02 DIAGNOSIS — D0511 Intraductal carcinoma in situ of right breast: Secondary | ICD-10-CM

## 2020-03-02 NOTE — Progress Notes (Signed)
Outpatient Surgical Follow Up  03/02/2020  Kristina Medina is an 86 y.o. female.   Chief Complaint  Patient presents with  . Follow-up    mammogram    HPI: Kristina Medina is an 86 year old female without prior history of right breast DCIS status post lumpectomy XRT 01/2017.  She is here for physical exam and review of the mammogram.  I personally reviewed the mammogram showing no evidence of any suspicious lesions.  She denies any breast issues.  No fevers chills no breast masses no discharge.  She thus reports some right knee pain as well as right hip pain related to her arthritis.  She does see rheumatology for this.  Past Medical History:  Diagnosis Date  . Anemia   . Arthritis   . Breast cancer ([place]) 01/06/2017   right breast  . Cancer ([place]) 01/31/2017   right breast ER< 10 %; PR: Neg.Nodes negative x 4.  . Cataract 09/2019  . Complication of anesthesia   . GERD (gastroesophageal reflux disease)   . History of kidney stones   . Hypertension   . Irregular heart beat   . Osteoporosis   . Patient is Jehovah's Witness   . Personal history of radiation therapy   . PONV (postoperative nausea and vomiting)    WITH GALLBLADDER SURGERY  . S/P right breast biopsy 1966    Past Surgical History:  Procedure Laterality Date  . ANKLE SURGERY Right 2005  . BREAST BIOPSY Right 01/06/2017   APOCRINE DUCTAL CARCINOMA IN SITU (DCIS),   . BREAST EXCISIONAL BIOPSY Right 1966   neg  . BREAST LUMPECTOMY Right 01/31/2017   Apocrine DCIS  . BREAST LUMPECTOMY WITH SENTINEL LYMPH NODE BIOPSY Right 01/31/2017   Procedure: BREAST LUMPECTOMY WITH SENTINEL LYMPH NODE BX;  Surgeon: Robert Bellow, MD;  Location: [facility];  Service: General;  Laterality: Right;  . CATARACT EXTRACTION W/PHACO Right 08/15/2019   Procedure: CATARACT EXTRACTION PHACO AND INTRAOCULAR LENS PLACEMENT (IOC) RIGHT CDE:  9.44, Total U/S Time:  01:00.5, FP3:  15.4%;  Surgeon: Marchia Meiers, MD;  Location: [place];   Service: Ophthalmology;  Laterality: Right;  . CATARACT EXTRACTION W/PHACO Left 09/24/2019   Procedure: CATARACT EXTRACTION PHACO AND INTRAOCULAR LENS PLACEMENT ([place]) LEFT DIABETIC;  Surgeon: Marchia Meiers, MD;  Location: [place];  Service: Ophthalmology;  Laterality: Left;  . CHOLECYSTECTOMY    . COLONOSCOPY    . INNER EAR SURGERY  2001   tumor removal  . ROTATOR CUFF REPAIR  2004    Family History  Problem Relation Age of Onset  . Breast cancer Sister 83  . Breast cancer Cousin   . Melanoma Brother   . Lupus Sister     Social History:  reports that she quit smoking about 37 years ago. Her smoking use included cigarettes. She has a 7.25 pack-year smoking history. She has never used smokeless tobacco. She reports that she does not drink alcohol and does not use drugs.  Allergies: No Known Allergies  Medications reviewed.    ROS Full ROS performed and is otherwise negative other than what is stated in HPI   BP (!) 155/78   Pulse 72   Temp (!) 97.2 F (36.2 C)   Ht 5\' 3"  (1.6 m)   Wt 114 lb (51.7 kg)   SpO2 97%   BMI 20.19 kg/m   Physical Exam Vitals and nursing note reviewed. Exam conducted with a chaperone present.  Constitutional:      General: She is  not in acute distress.    Appearance: Normal appearance. She is normal weight.  Eyes:     General:        Right eye: No discharge.        Left eye: No discharge.  Cardiovascular:     Rate and Rhythm: Normal rate and regular rhythm.  Pulmonary:     Effort: Pulmonary effort is normal. No respiratory distress.     Breath sounds: Normal breath sounds. No stridor.     Comments: BREAST: Prior lumpectomy scar on the right side.  No new concerning lesions.  No evidence of lymphadenopathy Abdominal:     General: Abdomen is flat. There is no distension.     Palpations: Abdomen is soft. There is no mass.     Tenderness: There is no abdominal tenderness.     Hernia: No hernia is present.  Musculoskeletal:         General: Normal range of motion.     Cervical back: Normal range of motion and neck supple. No rigidity.  Lymphadenopathy:     Cervical: No cervical adenopathy.  Skin:    General: Skin is warm and dry.     Capillary Refill: Capillary refill takes less than 2 seconds.  Neurological:     General: No focal deficit present.     Mental Status: She is alert and oriented to person, place, and time.  Psychiatric:        Mood and Affect: Mood normal.        Behavior: Behavior normal.        Thought Content: Thought content normal.        Judgment: Judgment normal.       Assessment/Plan: HX Right DCIS no evidence of recurrence. She wishes to continue with Yearly follow-up with mammogram physical exam.  Greater than 50% of the 25 minutes  visit was spent in counseling/coordination of care   Caroleen Hamman, MD [place] Surgeon

## 2020-03-02 NOTE — Patient Instructions (Addendum)
Patient will be asked to return to the office in one year with a bilateral diagnostic mammogram. We will send a letter with these appointments.  Continue self breast exams. Call office for any new breast issues or concerns.

## 2020-07-08 ENCOUNTER — Ambulatory Visit: Payer: Medicare (Managed Care) | Admitting: Radiation Oncology

## 2020-08-05 ENCOUNTER — Other Ambulatory Visit: Payer: Self-pay

## 2020-08-05 ENCOUNTER — Ambulatory Visit
Admission: RE | Admit: 2020-08-05 | Discharge: 2020-08-05 | Disposition: A | Discharge status: Home / Self Care | Payer: Medicare (Managed Care) | Source: Ambulatory Visit | Attending: Radiation Oncology | Admitting: Radiation Oncology

## 2020-08-05 DIAGNOSIS — Z170 Estrogen receptor positive status [ER+]: Secondary | ICD-10-CM | POA: Diagnosis not present

## 2020-08-05 DIAGNOSIS — Z923 Personal history of irradiation: Secondary | ICD-10-CM | POA: Insufficient documentation

## 2020-08-05 DIAGNOSIS — D0511 Intraductal carcinoma in situ of right breast: Secondary | ICD-10-CM | POA: Diagnosis present

## 2020-08-05 DIAGNOSIS — Z79810 Long term (current) use of selective estrogen receptor modulators (SERMs): Secondary | ICD-10-CM | POA: Diagnosis not present

## 2020-08-05 NOTE — Progress Notes (Signed)
Radiation Oncology Follow up Note  Name: Kristina Medina   Date:   08/05/2020 MRN:  [id number] DOB: [birth year]    This 87 y.o. female presents to the clinic today for 3-year follow-up status post whole breast radiation to her right breast for ER/PR positive ductal carcinoma in situ.  REFERRING PROVIDER: [facility] [place]  HPI: Patient is a 87 year old female now at 3 years having completed whole breast radiation to her right breast for ER/PR positive ductal carcinoma in situ seen today in routine follow-up she is doing well.  She specifically denies breast tenderness cough or bone pain.  She is currently on tamoxifen tolerating that well without side effect.  Her most recent mammograms were.  Back in September and were BI-RADS 2 benign.  COMPLICATIONS OF TREATMENT: none  FOLLOW UP COMPLIANCE: keeps appointments   PHYSICAL EXAM:  BP (!) (P) 164/63 (BP Location: Left Arm, Patient Position: Sitting)   Pulse (!) (P) 57   Resp (P) 16   Wt (P) 119 lb 11.2 oz (54.3 kg)   BMI (P) 21.20 kg/m  Lungs are clear to A&P cardiac examination essentially unremarkable with regular rate and rhythm. No dominant mass or nodularity is noted in either breast in 2 positions examined. Incision is well-healed. No axillary or supraclavicular adenopathy is appreciated. Cosmetic result is excellent.  Well-developed well-nourished patient in NAD. HEENT reveals PERLA, EOMI, discs not visualized.  Oral cavity is clear. No oral mucosal lesions are identified. Neck is clear without evidence of cervical or supraclavicular adenopathy. Lungs are clear to A&P. Cardiac examination is essentially unremarkable with regular rate and rhythm without murmur rub or thrill. Abdomen is benign with no organomegaly or masses noted. Motor sensory and DTR levels are equal and symmetric in the upper and lower extremities. Cranial nerves II through XII are grossly intact. Proprioception is intact. No peripheral adenopathy or edema is  identified. No motor or sensory levels are noted. Crude visual fields are within normal range.  RADIOLOGY RESULTS: Mammograms reviewed compatible with above-stated findings  PLAN: Present time patient is doing well with no evidence of disease now out 3 years.  I am pleased with her overall progress.  I have asked to see her back in 1 year for follow-up and then will discontinue follow-up care.Patient knows to call with any concerns.  She continues on soft tamoxifen without side effect.  I would like to take this opportunity to thank you for allowing me to participate in the care of your patient.Noreene Filbert, MD

## 2020-10-22 ENCOUNTER — Ambulatory Visit: Payer: Medicare (Managed Care) | Attending: Neurology

## 2020-10-22 DIAGNOSIS — G4700 Insomnia, unspecified: Secondary | ICD-10-CM | POA: Diagnosis not present

## 2020-10-22 DIAGNOSIS — I10 Essential (primary) hypertension: Secondary | ICD-10-CM | POA: Diagnosis not present

## 2020-10-22 DIAGNOSIS — G4733 Obstructive sleep apnea (adult) (pediatric): Secondary | ICD-10-CM | POA: Diagnosis not present

## 2020-10-23 ENCOUNTER — Other Ambulatory Visit: Payer: Self-pay

## 2021-01-20 IMAGING — US US RENAL ARTERY STENOSIS
1 series · 13 of 25 positions shown · non-contrast
Comparison: None.

CLINICAL DATA: Hypertensive chronic kidney disease. Evaluate for
renal artery stenosis.

EXAM:
RENAL/URINARY TRACT ULTRASOUND
RENAL DUPLEX DOPPLER ULTRASOUND

[Series 1: us renal artery duplex complete · 13 of 66 slices shown]
[im 1/66]
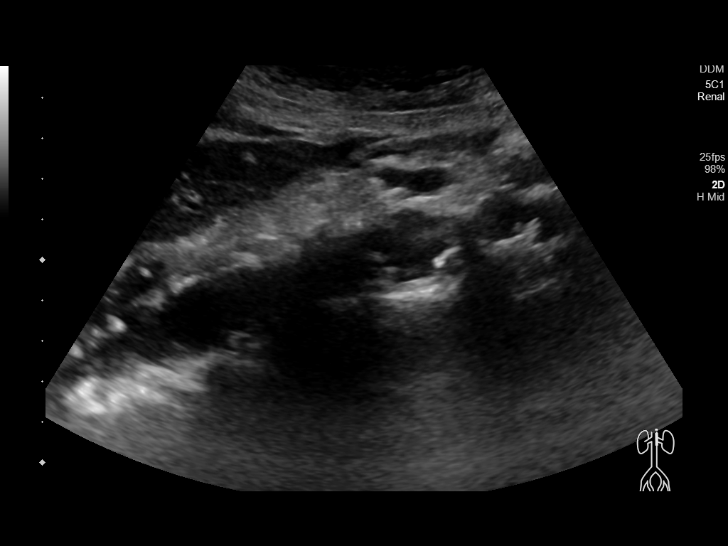
[im 6/66]
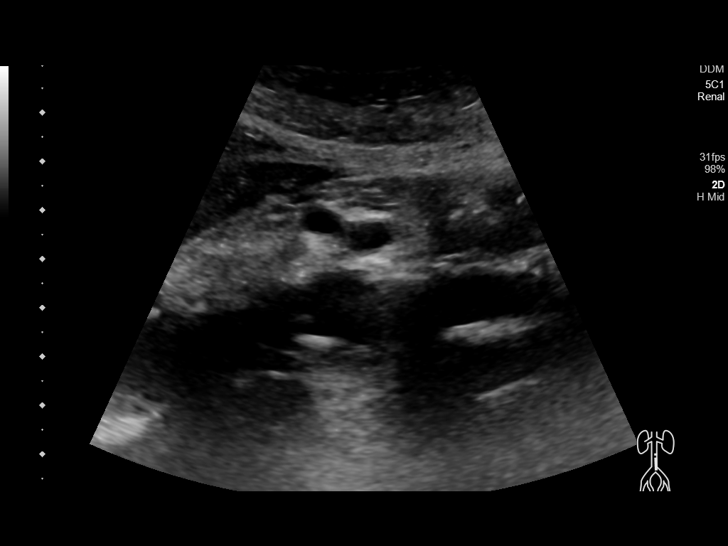
[im 11/66]
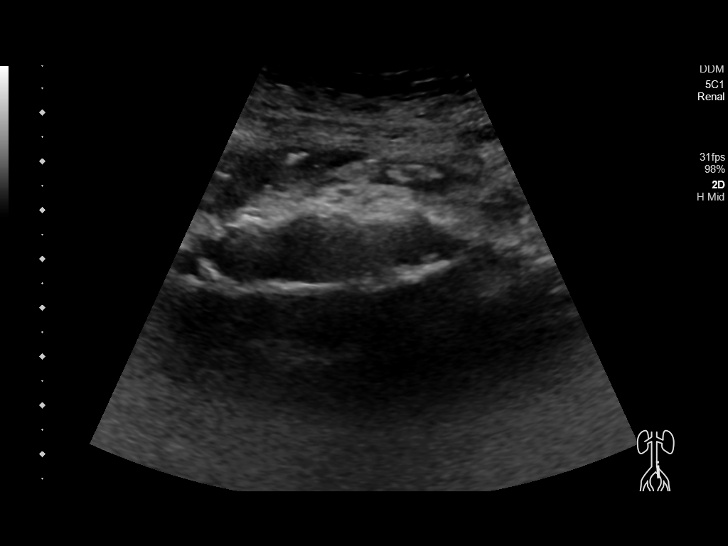
[im 17/66]
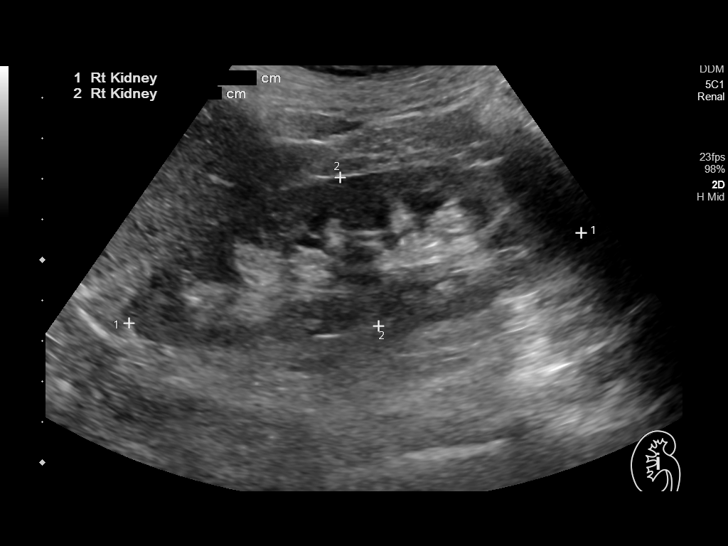
[im 22/66]
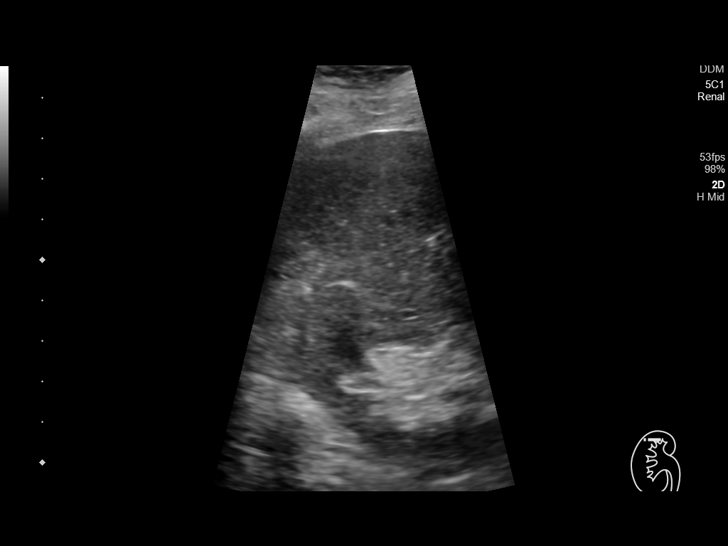
[im 28/66]
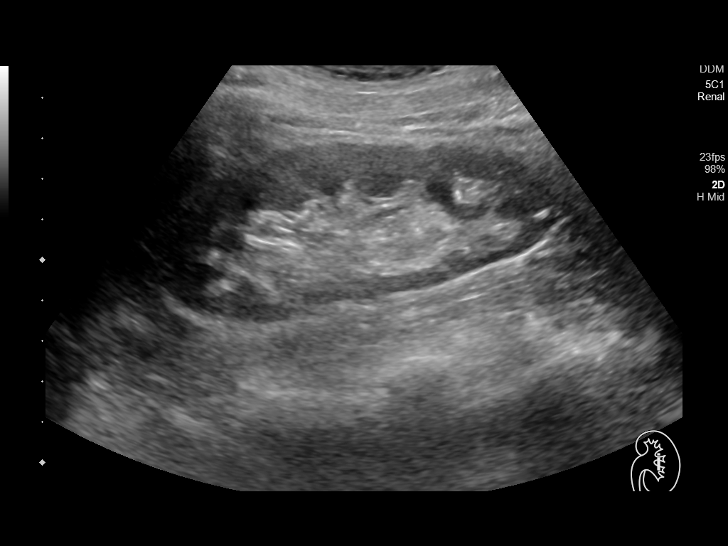
[im 33/66]
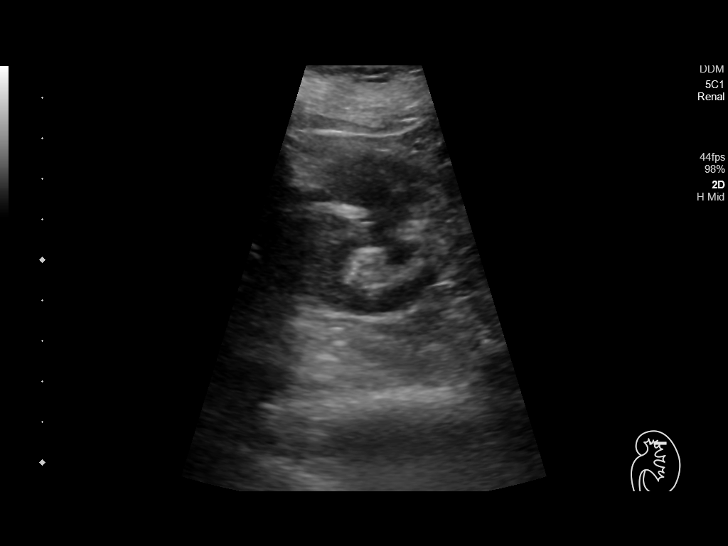
[im 38/66]
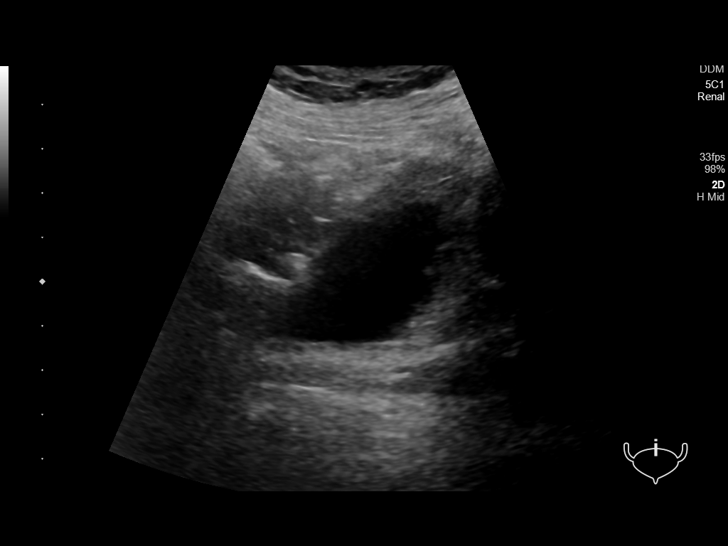
[im 44/66]
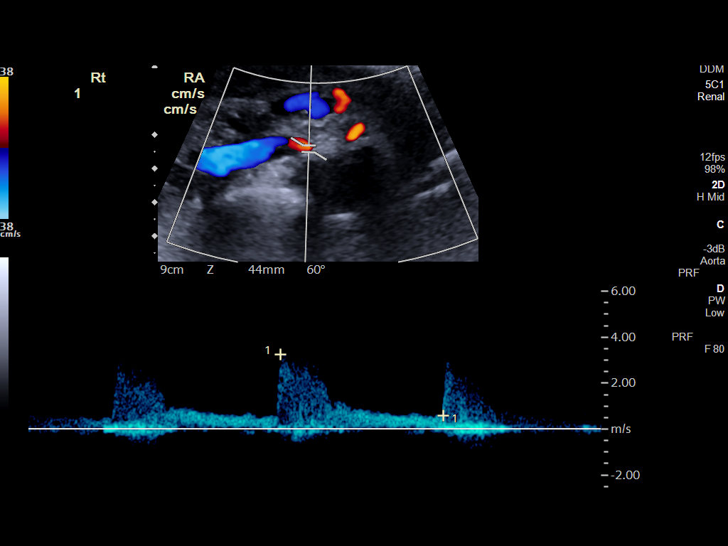
[im 49/66]
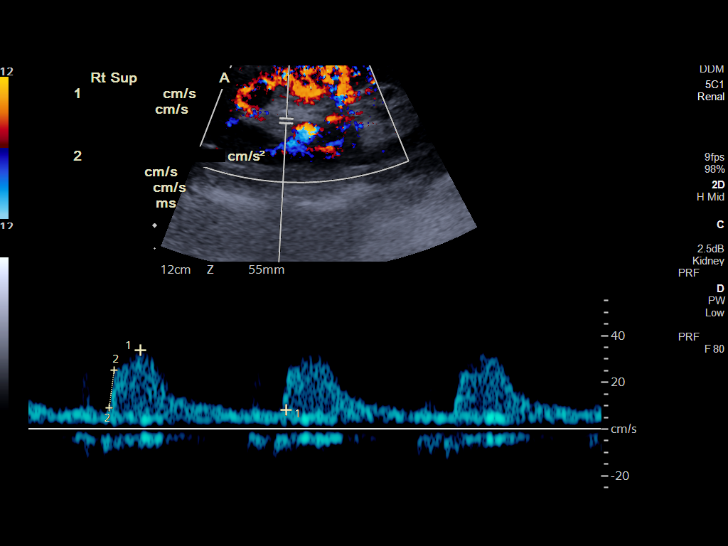
[im 55/66]
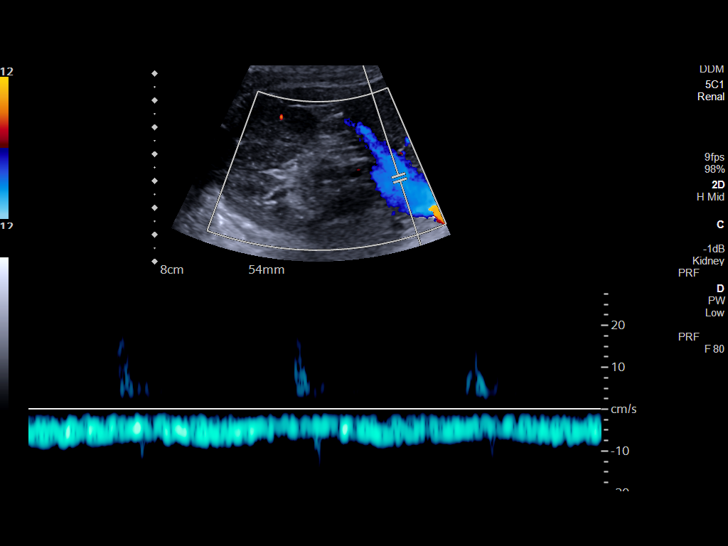
[im 60/66]
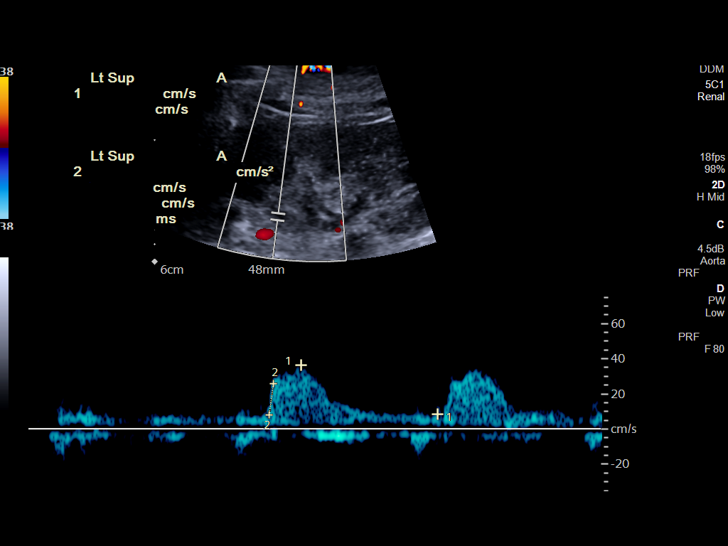
[im 66/66]
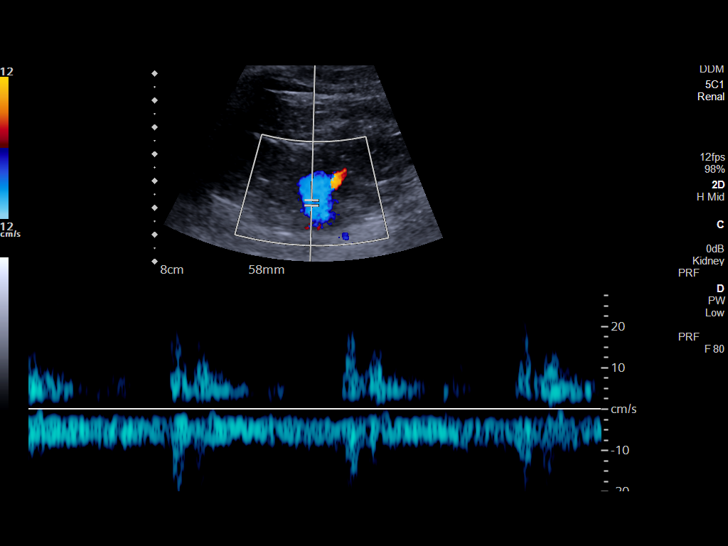

[13 of 25 positions shown; findings below may reference images not displayed]

FINDINGS: Right Kidney:

Normal cortical thickness, echogenicity and size, measuring 11.4 cm
in length. No focal renal lesions. No echogenic renal stones. No
urinary obstruction.

Left Kidney:

Normal cortical thickness, echogenicity and size, measuring 10.5 cm
in length. No focal renal lesions. No echogenic renal stones. No
urinary obstruction.

Bladder:  Appears normal given degree of distention.

_________________________________________________________

RENAL DUPLEX ULTRASOUND

Right Renal Artery Velocities:

Origin:  325 cm/sec

Mid:  252 cm/sec

Hilum:  69 cm/sec

Interlobar:  40 cm/sec

Arcuate:  30 cm/sec

Left Renal Artery Velocities:

Origin:  67 cm/sec

Mid:  53 cm/sec

Hilum:  58 cm/sec

Interlobar:  36 cm/sec

Arcuate:  26 cm/sec

Aortic Velocity:  159 cm/sec

Right Renal-Aortic Ratios:

Origin:

Mid:

Hilum:

Interlobar:

Arcuate:

Left Renal-Aortic Ratios:

Origin:

Mid:

Hilum:

Interlobar:

Arcuate:

Elevated peak systolic velocities are seen involving the origin and
mid aspects of the right renal artery (images 45 and 47).

The remainder of the interrogated portions of the bilateral renal
arteries and renal parenchyma demonstrates normal velocities and low
resistance waveforms.

The bilateral renal veins appear patent.

There is a large amount of irregular mixed echogenic partially
shadowing plaque throughout the normal caliber abdominal aorta.
IMPRESSION: 1. Potential hemodynamically significant narrowing involving the
right renal artery without associated asymmetric renal atrophy.
Further evaluation with CTA could be performed as clinically
indicated.
2. Otherwise, normal renal ultrasound. No evidence of renal atrophy
or urinary obstruction.
3. Large amount of atherosclerotic plaque throughout the normal
caliber abdominal aorta. Aortic Atherosclerosis ([id number]).

## 2021-01-28 ENCOUNTER — Other Ambulatory Visit: Payer: Self-pay

## 2021-01-28 DIAGNOSIS — Z1231 Encounter for screening mammogram for malignant neoplasm of breast: Secondary | ICD-10-CM

## 2021-02-23 ENCOUNTER — Other Ambulatory Visit: Payer: Self-pay

## 2021-02-23 ENCOUNTER — Ambulatory Visit
Admission: RE | Admit: 2021-02-23 | Discharge: 2021-02-23 | Disposition: A | Discharge status: Home / Self Care | Payer: Medicare (Managed Care) | Source: Ambulatory Visit | Attending: Surgery | Admitting: Surgery

## 2021-02-23 DIAGNOSIS — Z1231 Encounter for screening mammogram for malignant neoplasm of breast: Secondary | ICD-10-CM | POA: Diagnosis present

## 2021-03-03 ENCOUNTER — Ambulatory Visit: Payer: Medicare (Managed Care) | Admitting: Surgery

## 2021-08-11 ENCOUNTER — Other Ambulatory Visit: Payer: Self-pay

## 2021-08-11 ENCOUNTER — Encounter: Payer: Self-pay | Admitting: Radiation Oncology

## 2021-08-11 ENCOUNTER — Ambulatory Visit
Admission: RE | Admit: 2021-08-11 | Discharge: 2021-08-11 | Disposition: A | Discharge status: Home / Self Care | Payer: Medicare (Managed Care) | Source: Ambulatory Visit | Attending: Radiation Oncology | Admitting: Radiation Oncology

## 2021-08-11 VITALS — BP 151/81 | HR 71 | Temp 98.5°F | Resp 20 | Wt 119.4 lb

## 2021-08-11 DIAGNOSIS — C50411 Malignant neoplasm of upper-outer quadrant of right female breast: Secondary | ICD-10-CM

## 2021-08-11 DIAGNOSIS — Z170 Estrogen receptor positive status [ER+]: Secondary | ICD-10-CM

## 2021-08-11 NOTE — Progress Notes (Signed)
Radiation Oncology Follow up Note  Name: Kristina Medina   Date:   08/11/2021 MRN:  [id number] DOB: 15-Apr-1933    This 88 y.o. female presents to the clinic today for over 4-year follow-up status post whole breast radiation to her right breast for ER/PR positive ductal carcinoma in situ.  REFERRING PROVIDER: Talbert Cage, FNP  HPI: Patient is a 88 year old female now out over 4 years having completed whole breast radiation to her right breast for ER/PR positive ductal carcinoma in situ.  Seen today in routine follow-up she is doing well specifically denies any breast tenderness cough or bone pain..  She had mammograms back in September which I have reviewed were BI-RADS 2 benign.  She is currently on tamoxifen tolerating it well without side effects.  COMPLICATIONS OF TREATMENT: none  FOLLOW UP COMPLIANCE: keeps appointments   PHYSICAL EXAM:  BP (!) 151/81 (BP Location: Left Arm, Patient Position: Sitting, Cuff Size: Small)    Pulse 71    Temp 98.5 F (36.9 C)    Resp 20    Wt 119 lb 6.4 oz (54.2 kg)    BMI 21.15 kg/m  Lungs are clear to A&P cardiac examination essentially unremarkable with regular rate and rhythm. No dominant mass or nodularity is noted in either breast in 2 positions examined. Incision is well-healed. No axillary or supraclavicular adenopathy is appreciated. Cosmetic result is excellent.  Well-developed well-nourished patient in NAD. HEENT reveals PERLA, EOMI, discs not visualized.  Oral cavity is clear. No oral mucosal lesions are identified. Neck is clear without evidence of cervical or supraclavicular adenopathy. Lungs are clear to A&P. Cardiac examination is essentially unremarkable with regular rate and rhythm without murmur rub or thrill. Abdomen is benign with no organomegaly or masses noted. Motor sensory and DTR levels are equal and symmetric in the upper and lower extremities. Cranial nerves II through XII are grossly intact. Proprioception is intact. No  peripheral adenopathy or edema is identified. No motor or sensory levels are noted. Crude visual fields are within normal range.  RADIOLOGY RESULTS: Mammograms reviewed compatible with above-stated findings  PLAN: Present time patient is doing well with no evidence of disease now out over 4 years.  On pleased with her overall progress.  I at this time I am going to discontinue follow-up care.  Her PMD can order her annual mammograms.  Patient knows to call at anytime with any concerns.  I would like to take this opportunity to thank you for allowing me to participate in the care of your patient.Noreene Filbert, MD

## 2021-10-08 ENCOUNTER — Other Ambulatory Visit: Payer: Self-pay | Admitting: Family Medicine

## 2021-10-08 DIAGNOSIS — Z1231 Encounter for screening mammogram for malignant neoplasm of breast: Secondary | ICD-10-CM

## 2022-02-22 ENCOUNTER — Ambulatory Visit
Admission: RE | Admit: 2022-02-22 | Discharge: 2022-02-22 | Disposition: A | Discharge status: Home / Self Care | Payer: Medicare (Managed Care) | Source: Ambulatory Visit | Attending: Family Medicine | Admitting: Family Medicine

## 2022-02-22 DIAGNOSIS — Z1231 Encounter for screening mammogram for malignant neoplasm of breast: Secondary | ICD-10-CM | POA: Insufficient documentation

## 2022-03-11 NOTE — Discharge Instructions (Addendum)
Instructions after Total Hip Replacement     James P. Holley Bouche., M.D.     Dept. of [facility]  Welcome Kristina Medina, Kristina Medina  [address]  Phone: [phone]   Fax: [phone]    DIET: Drink plenty of non-alcoholic fluids. Resume your normal diet. Include foods high in fiber.  ACTIVITY:  You may use crutches or a walker with weight-bearing as tolerated, unless instructed otherwise. You may be weaned off of the walker or crutches by your Physical Therapist.  Do NOT reach below the level of your knees or cross your legs until allowed.    Continue doing gentle exercises. Exercising will reduce the pain and swelling, increase motion, and prevent muscle weakness.   Please continue to use the TED compression stockings for 6 weeks. You may remove the stockings at night, but should reapply them in the morning. Do not drive or operate any equipment until instructed.  WOUND CARE:  Continue to use ice packs periodically to reduce pain and swelling. Keep the incision clean and dry. You may bathe or shower after the staples are removed at the first office visit following surgery. Staples to be removed at 2 weeks after surgery. Steri strips should be placed over incision at this time. If not able to remove staples at facility, make f/u appointment with Cassell Smiles, PA-C 2 weeks from surgery.  MEDICATIONS: You may resume your regular medications. Please take the pain medication as prescribed on the medication. Do not take pain medication on an empty stomach. You have been given a prescription for a blood thinner to prevent blood clots. Please take the medication as instructed. (NOTE: After completing a 2 week course of Lovenox, take one Enteric-coated aspirin once a day.) Pain medications and iron supplements can cause constipation. Use a stool softener (Senokot or Colace) on a daily basis and a laxative (dulcolax or miralax) as needed. Do not drive or  drink alcoholic beverages when taking pain medications.  CALL THE OFFICE FOR: Temperature above 101 degrees Excessive bleeding or drainage on the dressing. Excessive swelling, coldness, or paleness of the toes. Persistent nausea and vomiting.  FOLLOW-UP:  You should have an appointment to return to the office in 6 weeks after surgery. Arrangements have been made for continuation of Physical Therapy (either home therapy or outpatient therapy).    [facility] Department Directory         [email or web address]       [email or web address]          Cardiology  Appointments: [place] [phone]  Endocrinology  Appointments: [place] [phone] [place] [phone]  Gastroenterology  Appointments: [place] [phone] [place] [phone]        General Surgery   Appointments: [facility]  Internal Medicine/Family Medicine  Appointments: [place] - [phone] [place] [phone]  Metabolic and Ammon Loss Surgery  Appointments: [facility]        Neurology  Appointments: [place] [phone] [place] - [phone]  Neurosurgery  Appointments: [place]  Obstetrics & Gynecology  Appointments: [place] [phone] [place] - [phone]        Pediatrics  Appointments: [place] [phone] [place] - [phone]  Physiatry  Appointments: [place] [phone]  Physical Therapy  Appointments: [place] - [phone]        Podiatry  Appointments: [place] [phone] [place] - [phone]  Pulmonology  Appointments: [place] [phone]  Rheumatology  Appointments: [place] Location: [facility]  [place], [place]  [address]  [place] Location: [facility]. [address] [place], [place]   [address]  [place] Location: [facility] [address] [place], [place]  [address]

## 2022-03-16 ENCOUNTER — Other Ambulatory Visit: Payer: Medicare (Managed Care)

## 2022-03-16 ENCOUNTER — Encounter
Admission: RE | Admit: 2022-03-16 | Discharge: 2022-03-16 | Disposition: A | Discharge status: Home / Self Care | Payer: Medicare (Managed Care) | Source: Ambulatory Visit | Attending: Orthopedic Surgery | Admitting: Orthopedic Surgery

## 2022-03-16 VITALS — BP 179/76 | HR 72 | Resp 14 | Ht 63.0 in | Wt 117.9 lb

## 2022-03-16 DIAGNOSIS — M1611 Unilateral primary osteoarthritis, right hip: Secondary | ICD-10-CM | POA: Insufficient documentation

## 2022-03-16 DIAGNOSIS — Z01818 Encounter for other preprocedural examination: Secondary | ICD-10-CM

## 2022-03-16 DIAGNOSIS — Z01812 Encounter for preprocedural laboratory examination: Secondary | ICD-10-CM | POA: Insufficient documentation

## 2022-03-16 DIAGNOSIS — M069 Rheumatoid arthritis, unspecified: Secondary | ICD-10-CM | POA: Diagnosis not present

## 2022-03-16 HISTORY — DX: Chronic obstructive pulmonary disease, unspecified: J44.9

## 2022-03-16 LAB — URINALYSIS, ROUTINE W REFLEX MICROSCOPIC
Bilirubin Urine: NEGATIVE
Glucose, UA: NEGATIVE mg/dL
Hgb urine dipstick: NEGATIVE
Ketones, ur: NEGATIVE mg/dL
Leukocytes,Ua: NEGATIVE
Nitrite: NEGATIVE
Protein, ur: NEGATIVE mg/dL
Specific Gravity, Urine: 1.003 — ABNORMAL LOW (ref 1.005–1.030)
pH: 7 (ref 5.0–8.0)

## 2022-03-16 LAB — C-REACTIVE PROTEIN: CRP: 0.7 mg/dL (ref <1.0)

## 2022-03-16 LAB — SEDIMENTATION RATE: Sed Rate: 17 mm/hr (ref 0–30)

## 2022-03-16 LAB — SURGICAL PCR SCREEN
MRSA, PCR: NEGATIVE
Staphylococcus aureus: NEGATIVE

## 2022-03-16 NOTE — Pre-Procedure Instructions (Signed)
Called [facility] and informed them of pt having surgery with [place]. Requesting that labs and ekg be sent to [place] for anesthesia review. Nurse verbalized she would fax these over to PAT

## 2022-03-16 NOTE — Patient Instructions (Addendum)
Your procedure is scheduled on:03-28-22 Monday Report to the Registration Desk on the 1st floor of the [facility].Then proceed to the 2nd floor Surgery Desk To find out your arrival time, please call [phone] between 1PM - 3PM on:03-25-22 Friday If your arrival time is 6:00 am, do not arrive prior to that time as the [facility] entrance doors do not open until 6:00 am.  REMEMBER: Instructions that are not followed completely may result in serious medical risk, up to and including death; or upon the discretion of your surgeon and anesthesiologist your surgery may need to be rescheduled.  Do not eat food after midnight the night before surgery.  No gum chewing, lozengers or hard candies.  You may however, drink CLEAR liquids up to 2 hours before you are scheduled to arrive for your surgery. Do not drink anything within 2 hours of your scheduled arrival time.  Clear liquids include: - water  - apple juice without pulp - gatorade (not RED colors) - black coffee or tea (Do NOT add milk or creamers to the coffee or tea) Do NOT drink anything that is not on this list.  In addition, your doctor has ordered for you to drink the provided  Ensure Pre-Surgery Clear Carbohydrate Drink Drinking this carbohydrate drink up to two hours before surgery helps to reduce insulin resistance and improve patient outcomes. Please complete drinking 2 hours prior to scheduled arrival time.  TAKE THESE MEDICATIONS THE MORNING OF SURGERY WITH A SIP OF WATER: -amLODipine (NORVASC)  -famotidine (PEPCID)   Stop Aspirin 7 days prior to surgery-Last dose on 03-20-22 Sunday  One week prior to surgery: Stop Anti-inflammatories (NSAIDS) such as Advil, Aleve, Ibuprofen, Motrin, Naproxen, Naprosyn and Aspirin based products such as Excedrin, Goodys Powder, BC Powder.You may however, continue to take Tylenol if needed for pain up until the day of surgery.  Stop ANY OVER THE COUNTER supplements/vitamins 7 days prior  to surgery (Multivitamin)  No Alcohol for 24 hours before or after surgery.  No Smoking including e-cigarettes for 24 hours prior to surgery.  No chewable tobacco products for at least 6 hours prior to surgery.  No nicotine patches on the day of surgery.  Do not use any "recreational" drugs for at least a week prior to your surgery.  Please be advised that the combination of cocaine and anesthesia may have negative outcomes, up to and including death. If you test positive for cocaine, your surgery will be cancelled.  On the morning of surgery brush your teeth with toothpaste and water, you may rinse your mouth with mouthwash if you wish. Do not swallow any toothpaste or mouthwash.  Use CHG Soap as directed on instruction sheet.  Do not wear jewelry, make-up, hairpins, clips or nail polish.  Do not wear lotions, powders, or perfumes.   Do not shave body from the neck down 48 hours prior to surgery just in case you cut yourself which could leave a site for infection.  Also, freshly shaved skin may become irritated if using the CHG soap.  Contact lenses, hearing aids and dentures may not be worn into surgery.  Do not bring valuables to the hospital. [facility] is not responsible for any missing/lost belongings or valuables.  Notify your doctor if there is any change in your medical condition (cold, fever, infection).  Wear comfortable clothing (specific to your surgery type) to the hospital.  After surgery, you can help prevent lung complications by doing breathing exercises.  Take deep breaths  and cough every 1-2 hours. Your doctor may order a device called an Incentive Spirometer to help you take deep breaths. When coughing or sneezing, hold a pillow firmly against your incision with both hands. This is called "splinting." Doing this helps protect your incision. It also decreases belly discomfort.  If you are being admitted to the hospital overnight, leave your suitcase in the  car. After surgery it may be brought to your room.  If you are being discharged the day of surgery, you will not be allowed to drive home. You will need a responsible adult (18 years or older) to drive you home and stay with you that night.   If you are taking public transportation, you will need to have a responsible adult (18 years or older) with you. Please confirm with your physician that it is acceptable to use public transportation.   Please call the [facility] at [phone] if you have any questions about these instructions.  Surgery Visitation Policy:  Patients undergoing a surgery or procedure may have two family members or support persons with them as long as the person is not COVID-19 positive or experiencing its symptoms.   Inpatient Visitation:    Visiting hours are 7 a.m. to 8 p.m. Up to four visitors are allowed at one time in a patient room, including children. The visitors may rotate out with other people during the day. One designated support person (adult) may remain overnight.    How to Use an Incentive Spirometer An incentive spirometer is a tool that measures how well you are filling your lungs with each breath. Learning to take long, deep breaths using this tool can help you keep your lungs clear and active. This may help to reverse or lessen your chance of developing breathing (pulmonary) problems, especially infection. You may be asked to use a spirometer: After a surgery. If you have a lung problem or a history of smoking. After a long period of time when you have been unable to move or be active. If the spirometer includes an indicator to show the highest number that you have reached, your health care provider or respiratory therapist will help you set a goal. Keep a log of your progress as told by your health care provider. What are the risks? Breathing too quickly may cause dizziness or cause you to pass out. Take your time so you do not  get dizzy or light-headed. If you are in pain, you may need to take pain medicine before doing incentive spirometry. It is harder to take a deep breath if you are having pain. How to use your incentive spirometer  Sit up on the edge of your bed or on a chair. Hold the incentive spirometer so that it is in an upright position. Before you use the spirometer, breathe out normally. Place the mouthpiece in your mouth. Make sure your lips are closed tightly around it. Breathe in slowly and as deeply as you can through your mouth, causing the piston or the ball to rise toward the top of the chamber. Hold your breath for 3-5 seconds, or for as long as possible. If the spirometer includes a coach indicator, use this to guide you in breathing. Slow down your breathing if the indicator goes above the marked areas. Remove the mouthpiece from your mouth and breathe out normally. The piston or ball will return to the bottom of the chamber. Rest for a few seconds, then repeat the steps 10 or more  times. Take your time and take a few normal breaths between deep breaths so that you do not get dizzy or light-headed. Do this every 1-2 hours when you are awake. If the spirometer includes a goal marker to show the highest number you have reached (best effort), use this as a goal to work toward during each repetition. After each set of 10 deep breaths, cough a few times. This will help to make sure that your lungs are clear. If you have an incision on your chest or abdomen from surgery, place a pillow or a rolled-up towel firmly against the incision when you cough. This can help to reduce pain while taking deep breaths and coughing. General tips When you are able to get out of bed: Walk around often. Continue to take deep breaths and cough in order to clear your lungs. Keep using the incentive spirometer until your health care provider says it is okay to stop using it. If you have been in the hospital, you may be  told to keep using the spirometer at home. Contact a health care provider if: You are having difficulty using the spirometer. You have trouble using the spirometer as often as instructed. Your pain medicine is not giving enough relief for you to use the spirometer as told. You have a fever. Get help right away if: You develop shortness of breath. You develop a cough with bloody mucus from the lungs. You have fluid or blood coming from an incision site after you cough. Summary An incentive spirometer is a tool that can help you learn to take long, deep breaths to keep your lungs clear and active. You may be asked to use a spirometer after a surgery, if you have a lung problem or a history of smoking, or if you have been inactive for a long period of time. Use your incentive spirometer as instructed every 1-2 hours while you are awake. If you have an incision on your chest or abdomen, place a pillow or a rolled-up towel firmly against your incision when you cough. This will help to reduce pain. Get help right away if you have shortness of breath, you cough up bloody mucus, or blood comes from your incision when you cough. This information is not intended to replace advice given to you by your health care provider. Make sure you discuss any questions you have with your health care provider. Document Revised: 08/26/2019 Document Reviewed: 08/26/2019 Elsevier Patient Education  [place].

## 2022-03-27 ENCOUNTER — Encounter: Payer: Self-pay | Admitting: Orthopedic Surgery

## 2022-03-27 DIAGNOSIS — M069 Rheumatoid arthritis, unspecified: Secondary | ICD-10-CM | POA: Insufficient documentation

## 2022-03-27 DIAGNOSIS — K219 Gastro-esophageal reflux disease without esophagitis: Secondary | ICD-10-CM | POA: Insufficient documentation

## 2022-03-27 DIAGNOSIS — I10 Essential (primary) hypertension: Secondary | ICD-10-CM | POA: Insufficient documentation

## 2022-03-27 DIAGNOSIS — M810 Age-related osteoporosis without current pathological fracture: Secondary | ICD-10-CM | POA: Insufficient documentation

## 2022-03-27 NOTE — H&P (Signed)
ORTHOPAEDIC HISTORY & PHYSICAL Regino Bellow, PA - 03/21/2022 2:15 PM EDT Formatting of this note is different from the original. Images from the original note were not included. Chief Complaint Chief Complaint  Patient presents with  Pre-op Exam  H&P right total hip arthroplasty 03/28/22   Reason for Visit Kristina Medina is a 88 y.o. who presents today for history and physical. She is to undergo a right total hip arthroplasty on 03/28/2022. Patient states that her pain has continued to the point that is significantly interfering with her activities of daily living she wishes to proceed with surgery.  She reports a long history of right hip and groin pain. The pain is worse with weight bearing and rotation of the hip . She has appreciated progressive loss of her hip range of motion as well as a sensation that the right leg is shorter. She reports a grinding sensation with certain movements of the hip. The hip pain limits the patient's ability to ambulate long distances. The patient has not appreciated any significant improvement despite Tylenol, topical NSAIDs, activity modification, and ambulatory aids. She is using a walker for ambulation.    Past Medical History Past Medical History:  Diagnosis Date  Bronchiectasis (CMS-HCC)  fibrosis/nodules  Bronchiectasis (CMS-HCC)  Cervical disc disease  COPD (chronic obstructive pulmonary disease) (CMS-HCC)  Diastolic dysfunction  Gastritis  a. Helicobacter  GERD (gastroesophageal reflux disease)  Hypertension  Kidney stones  Osteoporosis  a. S/p Actonel, stopped because of cost. b.Fosamax. c. Right ankle fracture  Rheumatoid arthritis (CMS-HCC)  a. Methotrexate b. Humira discontinued because of sinus infections   Past Surgical History Past Surgical History:  Procedure Laterality Date  COLONOSCOPY 11/07/1997  COLONOSCOPY 10/01/2008  Int hemorrhoids: No repeat per RTE  CHOLECYSTECTOMY  EGD  September 2012  Staves  OTHER SURGERY  Ankle fracture 2005 with surgery, metal placed  Rotator cuff surgery Left   Past Family History Family History  Problem Relation Age of Onset  Coronary Artery Disease (Blocked arteries around heart) Father  Hip fracture Mother  Arthritis Mother  Breast cancer Sister   Medications Current Outpatient Medications Ordered in Epic  Medication Sig Dispense Refill  amLODIPine (NORVASC) 2.5 MG tablet Take 2.5 mg by mouth once daily  amLODIPine (NORVASC) 5 MG tablet Take 5 mg by mouth once daily  aspirin 81 MG EC tablet Take 81 mg by mouth once daily.  calcium carbonate 600 mg calcium (1,500 mg) Tab tablet Take 600 mg by mouth 2 (two) times daily.  cetirizine (ZYRTEC) 10 MG tablet Take 10 mg by mouth once daily.  cloNIDine (CATAPRES-TTS) 0.1 mg/24 hr patch Place 1 patch onto the skin every 7 (seven) days  diclofenac (VOLTAREN) 1 % topical gel Apply 2 g topically 3 (three) times daily  famotidine (PEPCID) 40 MG tablet Take 40 mg by mouth at bedtime  folic acid (FOLVITE) 1 MG tablet TAKE 1 TABLET BY MOUTH EVERY DAY 30 tablet 5  lidocaine (LIDODERM) 5 % patch Place 1 patch onto the skin daily Apply patch to the most painful area for up to 12 hours in a 24 hour period.  montelukast (SINGULAIR) 10 mg tablet Take 10 mg by mouth once daily  olmesartan (BENICAR) 40 MG tablet Take 40 mg by mouth once daily  traZODone (DESYREL) 50 MG tablet Take 50 mg by mouth nightly  tamoxifen (NOLVADEX) 20 MG tablet Take 20 mg by mouth once daily (Patient not taking: Reported on 03/21/2022)  No current Epic-ordered facility-administered medications on file.   Allergies Allergies  Allergen Reactions  Plaquenil [Hydroxychloroquine] Diarrhea and Headache  Sulfasalazine Headache  GI distress    Review of Systems A comprehensive 14 point ROS was performed, reviewed, and the pertinent orthopaedic findings are documented in the HPI.  Exam BP (!) 142/68  Ht 160 cm (5'  3")  Wt 53.2 kg (117 lb 3.2 oz)  BMI 20.76 kg/m   General: Well-developed well-nourished female seen in no acute distress.   HEENT: Atraumatic,normocephalic. Pupils are equal and reactive to light. Oropharynx is clear with moist mucosa  Lungs: Clear to auscultation bilaterally   Cardiovascular: Regular rate and rhythm. Normal S1, S2. She is noted to have a grade 1/2 systolic murmur no appreciable gallops or rubs. Peripheral pulses are palpable.  Abdomen: Soft, non-tender, nondistended. Bowel sounds present  Extremity: Right Hip: Pelvic tilt: Positive Limb lengths: The right leg is shorter with the patient standing Soft tissue swelling: Negative Erythema: Negative Crepitance: Positive Tenderness: Greater trochanter is nontender to palpation. Severe pain is elicited by axial compression or extremes of rotation. Atrophy: No atrophy. Fair to good hip flexor and abductor strength. Range of Motion: EXT/FLEX: 0/0/90 ADD/ABD: 10/0/10 IR/ER: 0/0/10   Neurological:  The patient is alert and oriented Sensation to light touch appears to be intact and within normal limits Gross motor strength appeared to be equal to 5/5  Vascular :  Peripheral pulses felt to be palpable. Capillary refill appears to be intact and within normal limits  X-ray  1. 3 views of the right hip ordered and interpreted on today's visit shows severe collapse of the femoral head of approximately half. Flattening of the femoral head is noted with bone-on-bone articulation being noted. She is noted to have severe osteophyte formation to the acetabulum. Subchondral sclerosis is noted as well as some cystic changes. She has partially dislocated  Impression  1. Degenerative arthrosis right hip secondary to rheumatoid arthritis  Plan   1. I did go over the patient's medication list on today's visit. She is already discontinued her aspirin. 2. Past medical history also evaluated. 3. Patient is planning on going  home due to her nieces for recovery. 4. Return to clinic 6 weeks postop 5. 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

## 2022-03-28 ENCOUNTER — Encounter
Admission: RE | Disposition: A | Discharge status: Home / Self Care | Payer: Self-pay | Source: Ambulatory Visit | Attending: Orthopedic Surgery

## 2022-03-28 ENCOUNTER — Inpatient Hospital Stay: Payer: Medicare (Managed Care) | Admitting: Urgent Care

## 2022-03-28 ENCOUNTER — Inpatient Hospital Stay
Admission: RE | Admit: 2022-03-28 | Discharge: 2022-03-29 | DRG: 470 | Disposition: A | Discharge status: Home / Self Care | Payer: Medicare (Managed Care) | Source: Ambulatory Visit | Attending: Orthopedic Surgery | Admitting: Orthopedic Surgery

## 2022-03-28 ENCOUNTER — Observation Stay: Payer: Medicare (Managed Care)

## 2022-03-28 ENCOUNTER — Encounter: Payer: Self-pay | Admitting: Orthopedic Surgery

## 2022-03-28 ENCOUNTER — Other Ambulatory Visit: Payer: Self-pay

## 2022-03-28 DIAGNOSIS — Z888 Allergy status to other drugs, medicaments and biological substances status: Secondary | ICD-10-CM

## 2022-03-28 DIAGNOSIS — Z8249 Family history of ischemic heart disease and other diseases of the circulatory system: Secondary | ICD-10-CM

## 2022-03-28 DIAGNOSIS — Z86718 Personal history of other venous thrombosis and embolism: Secondary | ICD-10-CM

## 2022-03-28 DIAGNOSIS — K219 Gastro-esophageal reflux disease without esophagitis: Secondary | ICD-10-CM | POA: Diagnosis present

## 2022-03-28 DIAGNOSIS — Z79811 Long term (current) use of aromatase inhibitors: Secondary | ICD-10-CM | POA: Diagnosis not present

## 2022-03-28 DIAGNOSIS — I10 Essential (primary) hypertension: Secondary | ICD-10-CM | POA: Diagnosis present

## 2022-03-28 DIAGNOSIS — Z8261 Family history of arthritis: Secondary | ICD-10-CM | POA: Diagnosis not present

## 2022-03-28 DIAGNOSIS — Z79899 Other long term (current) drug therapy: Secondary | ICD-10-CM | POA: Diagnosis not present

## 2022-03-28 DIAGNOSIS — Z803 Family history of malignant neoplasm of breast: Secondary | ICD-10-CM | POA: Diagnosis not present

## 2022-03-28 DIAGNOSIS — Z853 Personal history of malignant neoplasm of breast: Secondary | ICD-10-CM | POA: Diagnosis not present

## 2022-03-28 DIAGNOSIS — M810 Age-related osteoporosis without current pathological fracture: Secondary | ICD-10-CM | POA: Diagnosis present

## 2022-03-28 DIAGNOSIS — Z923 Personal history of irradiation: Secondary | ICD-10-CM | POA: Diagnosis not present

## 2022-03-28 DIAGNOSIS — Z96641 Presence of right artificial hip joint: Secondary | ICD-10-CM

## 2022-03-28 DIAGNOSIS — M1611 Unilateral primary osteoarthritis, right hip: Secondary | ICD-10-CM | POA: Diagnosis present

## 2022-03-28 DIAGNOSIS — Z882 Allergy status to sulfonamides status: Secondary | ICD-10-CM | POA: Diagnosis not present

## 2022-03-28 DIAGNOSIS — M069 Rheumatoid arthritis, unspecified: Secondary | ICD-10-CM | POA: Diagnosis present

## 2022-03-28 DIAGNOSIS — J449 Chronic obstructive pulmonary disease, unspecified: Secondary | ICD-10-CM | POA: Diagnosis present

## 2022-03-28 DIAGNOSIS — Z7982 Long term (current) use of aspirin: Secondary | ICD-10-CM

## 2022-03-28 DIAGNOSIS — Z23 Encounter for immunization: Secondary | ICD-10-CM

## 2022-03-28 HISTORY — PX: TOTAL HIP ARTHROPLASTY: SHX124

## 2022-03-28 SURGERY — ARTHROPLASTY, HIP, TOTAL,POSTERIOR APPROACH
Anesthesia: Spinal | Site: Hip | Laterality: Right

## 2022-03-28 MED ORDER — PROPOFOL 500 MG/50ML IV EMUL
INTRAVENOUS | Status: DC | PRN
  Administered 2022-03-28: 100 ug/kg/min via INTRAVENOUS

## 2022-03-28 MED ORDER — ACETAMINOPHEN 10 MG/ML IV SOLN
INTRAVENOUS | Status: AC
  Filled 2022-03-28: qty 100

## 2022-03-28 MED ORDER — ACETAMINOPHEN 10 MG/ML IV SOLN
INTRAVENOUS | Status: DC | PRN
  Administered 2022-03-28: 1000 mg via INTRAVENOUS

## 2022-03-28 MED ORDER — TRANEXAMIC ACID-NACL 1000-0.7 MG/100ML-% IV SOLN
1000.0000 mg | INTRAVENOUS | Status: AC
  Administered 2022-03-28: 1000 mg via INTRAVENOUS

## 2022-03-28 MED ORDER — PHENYLEPHRINE 80 MCG/ML (10ML) SYRINGE FOR IV PUSH (FOR BLOOD PRESSURE SUPPORT)
PREFILLED_SYRINGE | INTRAVENOUS | Status: AC
  Filled 2022-03-28: qty 10

## 2022-03-28 MED ORDER — ENOXAPARIN SODIUM 30 MG/0.3ML IJ SOSY
30.0000 mg | PREFILLED_SYRINGE | Freq: Two times a day (BID) | INTRAMUSCULAR | Status: DC
  Administered 2022-03-29: 30 mg via SUBCUTANEOUS
  Filled 2022-03-28: qty 0.3

## 2022-03-28 MED ORDER — OXYCODONE HCL 5 MG PO TABS: 10.0000 mg | ORAL_TABLET | ORAL | Status: DC | PRN

## 2022-03-28 MED ORDER — SODIUM CHLORIDE 0.9 % IV SOLN: INTRAVENOUS | Status: DC

## 2022-03-28 MED ORDER — CHLORHEXIDINE GLUCONATE 0.12 % MT SOLN: 15.0000 mL | Freq: Once | OROMUCOSAL | Status: AC

## 2022-03-28 MED ORDER — METOCLOPRAMIDE HCL 5 MG PO TABS
10.0000 mg | ORAL_TABLET | Freq: Three times a day (TID) | ORAL | Status: DC
  Administered 2022-03-28 – 2022-03-29 (×2): 10 mg via ORAL
  Filled 2022-03-28 (×3): qty 2

## 2022-03-28 MED ORDER — LIDOCAINE HCL (PF) 2 % IJ SOLN
INTRAMUSCULAR | Status: AC
  Filled 2022-03-28: qty 5

## 2022-03-28 MED ORDER — MENTHOL 3 MG MT LOZG: 1.0000 | LOZENGE | OROMUCOSAL | Status: DC | PRN

## 2022-03-28 MED ORDER — LACTATED RINGERS IV SOLN: INTRAVENOUS | Status: DC

## 2022-03-28 MED ORDER — FLUTICASONE PROPIONATE 50 MCG/ACT NA SUSP: 2.0000 | Freq: Every day | NASAL | Status: DC | PRN

## 2022-03-28 MED ORDER — PANTOPRAZOLE SODIUM 40 MG PO TBEC
40.0000 mg | DELAYED_RELEASE_TABLET | Freq: Two times a day (BID) | ORAL | Status: DC
  Administered 2022-03-28 – 2022-03-29 (×3): 40 mg via ORAL
  Filled 2022-03-28 (×3): qty 1

## 2022-03-28 MED ORDER — PHENYLEPHRINE HCL-NACL 20-0.9 MG/250ML-% IV SOLN
INTRAVENOUS | Status: DC | PRN
  Administered 2022-03-28: 50 ug/min via INTRAVENOUS

## 2022-03-28 MED ORDER — FENTANYL CITRATE (PF) 100 MCG/2ML IJ SOLN: 25.0000 ug | INTRAMUSCULAR | Status: DC | PRN

## 2022-03-28 MED ORDER — BUPIVACAINE HCL (PF) 0.5 % IJ SOLN
INTRAMUSCULAR | Status: AC
  Filled 2022-03-28: qty 10

## 2022-03-28 MED ORDER — PHENYLEPHRINE HCL (PRESSORS) 10 MG/ML IV SOLN
INTRAVENOUS | Status: DC | PRN
  Administered 2022-03-28: 160 ug via INTRAVENOUS

## 2022-03-28 MED ORDER — SURGIRINSE WOUND IRRIGATION SYSTEM - OPTIME
TOPICAL | Status: DC | PRN
  Administered 2022-03-28: 900 mL via TOPICAL

## 2022-03-28 MED ORDER — OYSTER SHELL CALCIUM/D3 500-5 MG-MCG PO TABS
1.0000 | ORAL_TABLET | Freq: Two times a day (BID) | ORAL | Status: DC
  Administered 2022-03-28 – 2022-03-29 (×3): 1 via ORAL
  Filled 2022-03-28 (×3): qty 1

## 2022-03-28 MED ORDER — EPHEDRINE 5 MG/ML INJ
INTRAVENOUS | Status: AC
  Filled 2022-03-28: qty 5

## 2022-03-28 MED ORDER — TRANEXAMIC ACID-NACL 1000-0.7 MG/100ML-% IV SOLN
INTRAVENOUS | Status: AC
  Administered 2022-03-28: 1000 mg via INTRAVENOUS
  Filled 2022-03-28: qty 100

## 2022-03-28 MED ORDER — CELECOXIB 200 MG PO CAPS
200.0000 mg | ORAL_CAPSULE | Freq: Two times a day (BID) | ORAL | Status: DC
  Administered 2022-03-29: 200 mg via ORAL
  Filled 2022-03-28: qty 1

## 2022-03-28 MED ORDER — CHLORHEXIDINE GLUCONATE 0.12 % MT SOLN
OROMUCOSAL | Status: AC
  Administered 2022-03-28: 15 mL via OROMUCOSAL
  Filled 2022-03-28: qty 15

## 2022-03-28 MED ORDER — CEFAZOLIN SODIUM-DEXTROSE 2-4 GM/100ML-% IV SOLN
INTRAVENOUS | Status: AC
  Filled 2022-03-28: qty 100

## 2022-03-28 MED ORDER — DEXAMETHASONE SODIUM PHOSPHATE 10 MG/ML IJ SOLN
INTRAMUSCULAR | Status: AC
  Administered 2022-03-28: 8 mg via INTRAVENOUS
  Filled 2022-03-28: qty 1

## 2022-03-28 MED ORDER — BISACODYL 10 MG RE SUPP: 10.0000 mg | Freq: Every day | RECTAL | Status: DC | PRN

## 2022-03-28 MED ORDER — ORAL CARE MOUTH RINSE: 15.0000 mL | Freq: Once | OROMUCOSAL | Status: AC

## 2022-03-28 MED ORDER — TRAZODONE HCL 50 MG PO TABS
50.0000 mg | ORAL_TABLET | Freq: Every day | ORAL | Status: DC
  Administered 2022-03-28: 50 mg via ORAL
  Filled 2022-03-28: qty 1

## 2022-03-28 MED ORDER — TRANEXAMIC ACID-NACL 1000-0.7 MG/100ML-% IV SOLN
INTRAVENOUS | Status: AC
  Filled 2022-03-28: qty 100

## 2022-03-28 MED ORDER — ADULT MULTIVITAMIN W/MINERALS CH
1.0000 | ORAL_TABLET | Freq: Every day | ORAL | Status: DC
  Administered 2022-03-28 – 2022-03-29 (×2): 1 via ORAL
  Filled 2022-03-28 (×2): qty 1

## 2022-03-28 MED ORDER — FLEET ENEMA 7-19 GM/118ML RE ENEM: 1.0000 | ENEMA | Freq: Once | RECTAL | Status: DC | PRN

## 2022-03-28 MED ORDER — KETAMINE HCL 10 MG/ML IJ SOLN
INTRAMUSCULAR | Status: DC | PRN
  Administered 2022-03-28: 20 mg via INTRAVENOUS
  Administered 2022-03-28: 10 mg via INTRAVENOUS

## 2022-03-28 MED ORDER — 0.9 % SODIUM CHLORIDE (POUR BTL) OPTIME
TOPICAL | Status: DC | PRN
  Administered 2022-03-28: 500 mL

## 2022-03-28 MED ORDER — ACETAMINOPHEN 325 MG PO TABS: 325.0000 mg | ORAL_TABLET | Freq: Four times a day (QID) | ORAL | Status: DC | PRN

## 2022-03-28 MED ORDER — PHENOL 1.4 % MT LIQD: 1.0000 | OROMUCOSAL | Status: DC | PRN

## 2022-03-28 MED ORDER — PROPOFOL 1000 MG/100ML IV EMUL
INTRAVENOUS | Status: AC
  Filled 2022-03-28: qty 100

## 2022-03-28 MED ORDER — ONDANSETRON HCL 4 MG/2ML IJ SOLN: 4.0000 mg | Freq: Four times a day (QID) | INTRAMUSCULAR | Status: DC | PRN

## 2022-03-28 MED ORDER — VASOPRESSIN 20 UNIT/ML IV SOLN
INTRAVENOUS | Status: DC | PRN
  Administered 2022-03-28 (×2): .5 [IU] via INTRAVENOUS

## 2022-03-28 MED ORDER — ALUM & MAG HYDROXIDE-SIMETH 200-200-20 MG/5ML PO SUSP: 30.0000 mL | ORAL | Status: DC | PRN

## 2022-03-28 MED ORDER — AMLODIPINE BESYLATE 5 MG PO TABS
7.5000 mg | ORAL_TABLET | Freq: Every day | ORAL | Status: DC
  Administered 2022-03-29: 7.5 mg via ORAL
  Filled 2022-03-28: qty 2

## 2022-03-28 MED ORDER — CELECOXIB 200 MG PO CAPS: 400.0000 mg | ORAL_CAPSULE | Freq: Once | ORAL | Status: AC

## 2022-03-28 MED ORDER — VASOPRESSIN 20 UNIT/ML IV SOLN
INTRAVENOUS | Status: AC
  Filled 2022-03-28: qty 1

## 2022-03-28 MED ORDER — LORATADINE 10 MG PO TABS
10.0000 mg | ORAL_TABLET | Freq: Every day | ORAL | Status: DC
  Administered 2022-03-28 – 2022-03-29 (×2): 10 mg via ORAL
  Filled 2022-03-28 (×2): qty 1

## 2022-03-28 MED ORDER — SODIUM CHLORIDE 0.9 % IR SOLN
Status: DC | PRN
  Administered 2022-03-28: 3000 mL

## 2022-03-28 MED ORDER — CELECOXIB 200 MG PO CAPS
ORAL_CAPSULE | ORAL | Status: AC
  Administered 2022-03-28: 400 mg via ORAL
  Filled 2022-03-28: qty 2

## 2022-03-28 MED ORDER — DEXAMETHASONE SODIUM PHOSPHATE 10 MG/ML IJ SOLN: 8.0000 mg | Freq: Once | INTRAMUSCULAR | Status: AC

## 2022-03-28 MED ORDER — MONTELUKAST SODIUM 10 MG PO TABS
10.0000 mg | ORAL_TABLET | Freq: Every day | ORAL | Status: DC
  Administered 2022-03-28: 10 mg via ORAL
  Filled 2022-03-28: qty 1

## 2022-03-28 MED ORDER — TRANEXAMIC ACID-NACL 1000-0.7 MG/100ML-% IV SOLN: 1000.0000 mg | Freq: Once | INTRAVENOUS | Status: AC

## 2022-03-28 MED ORDER — EPHEDRINE SULFATE (PRESSORS) 50 MG/ML IJ SOLN
INTRAMUSCULAR | Status: DC | PRN
  Administered 2022-03-28 (×4): 5 mg via INTRAVENOUS
  Administered 2022-03-28: 10 mg via INTRAVENOUS
  Administered 2022-03-28: 5 mg via INTRAVENOUS
  Administered 2022-03-28: 10 mg via INTRAVENOUS

## 2022-03-28 MED ORDER — OXYCODONE HCL 5 MG PO TABS
5.0000 mg | ORAL_TABLET | ORAL | Status: DC | PRN
  Administered 2022-03-28: 5 mg via ORAL
  Filled 2022-03-28: qty 1

## 2022-03-28 MED ORDER — MAGNESIUM HYDROXIDE 400 MG/5ML PO SUSP
30.0000 mL | Freq: Every day | ORAL | Status: DC
  Administered 2022-03-28 – 2022-03-29 (×2): 30 mL via ORAL
  Filled 2022-03-28 (×2): qty 30

## 2022-03-28 MED ORDER — GABAPENTIN 300 MG PO CAPS
ORAL_CAPSULE | ORAL | Status: AC
  Administered 2022-03-28: 300 mg via ORAL
  Filled 2022-03-28: qty 1

## 2022-03-28 MED ORDER — ONDANSETRON HCL 4 MG PO TABS: 4.0000 mg | ORAL_TABLET | Freq: Four times a day (QID) | ORAL | Status: DC | PRN

## 2022-03-28 MED ORDER — AMLODIPINE BESYLATE 5 MG PO TABS: 5.0000 mg | ORAL_TABLET | ORAL | Status: DC

## 2022-03-28 MED ORDER — IRBESARTAN 150 MG PO TABS
300.0000 mg | ORAL_TABLET | Freq: Every day | ORAL | Status: DC
  Administered 2022-03-28 – 2022-03-29 (×2): 300 mg via ORAL
  Filled 2022-03-28 (×2): qty 2

## 2022-03-28 MED ORDER — SENNOSIDES-DOCUSATE SODIUM 8.6-50 MG PO TABS
1.0000 | ORAL_TABLET | Freq: Two times a day (BID) | ORAL | Status: DC
  Administered 2022-03-28 – 2022-03-29 (×3): 1 via ORAL
  Filled 2022-03-28 (×3): qty 1

## 2022-03-28 MED ORDER — HYDROMORPHONE HCL 1 MG/ML IJ SOLN: 0.5000 mg | INTRAMUSCULAR | Status: DC | PRN

## 2022-03-28 MED ORDER — TRAMADOL HCL 50 MG PO TABS: 50.0000 mg | ORAL_TABLET | ORAL | Status: DC | PRN

## 2022-03-28 MED ORDER — TRAMADOL HCL 50 MG PO TABS
ORAL_TABLET | ORAL | Status: AC
  Administered 2022-03-28: 100 mg via ORAL
  Filled 2022-03-28: qty 2

## 2022-03-28 MED ORDER — DIPHENHYDRAMINE HCL 12.5 MG/5ML PO ELIX: 12.5000 mg | ORAL_SOLUTION | ORAL | Status: DC | PRN

## 2022-03-28 MED ORDER — PROPOFOL 10 MG/ML IV BOLUS
INTRAVENOUS | Status: DC | PRN
  Administered 2022-03-28: 20 mg via INTRAVENOUS
  Administered 2022-03-28: 15 mg via INTRAVENOUS

## 2022-03-28 MED ORDER — KETAMINE HCL 50 MG/5ML IJ SOSY
PREFILLED_SYRINGE | INTRAMUSCULAR | Status: AC
  Filled 2022-03-28: qty 5

## 2022-03-28 MED ORDER — PHENYLEPHRINE HCL-NACL 20-0.9 MG/250ML-% IV SOLN
INTRAVENOUS | Status: AC
  Filled 2022-03-28: qty 250

## 2022-03-28 MED ORDER — CLONIDINE HCL 0.1 MG/24HR TD PTWK: 0.1000 mg | MEDICATED_PATCH | TRANSDERMAL | Status: DC

## 2022-03-28 MED ORDER — FOLIC ACID 1 MG PO TABS
1.0000 mg | ORAL_TABLET | Freq: Every day | ORAL | Status: DC
  Administered 2022-03-28 – 2022-03-29 (×2): 1 mg via ORAL
  Filled 2022-03-28 (×2): qty 1

## 2022-03-28 MED ORDER — BUPIVACAINE HCL (PF) 0.5 % IJ SOLN
INTRAMUSCULAR | Status: DC | PRN
  Administered 2022-03-28: 2.9 mL via INTRATHECAL

## 2022-03-28 MED ORDER — ENSURE PRE-SURGERY PO LIQD
296.0000 mL | Freq: Once | ORAL | Status: DC
  Filled 2022-03-28: qty 296

## 2022-03-28 MED ORDER — ACETAMINOPHEN 10 MG/ML IV SOLN
1000.0000 mg | Freq: Four times a day (QID) | INTRAVENOUS | Status: DC
  Administered 2022-03-28 – 2022-03-29 (×3): 1000 mg via INTRAVENOUS
  Filled 2022-03-28 (×3): qty 100

## 2022-03-28 MED ORDER — FERROUS SULFATE 325 (65 FE) MG PO TABS
325.0000 mg | ORAL_TABLET | Freq: Two times a day (BID) | ORAL | Status: DC
  Administered 2022-03-28 – 2022-03-29 (×2): 325 mg via ORAL
  Filled 2022-03-28 (×2): qty 1

## 2022-03-28 MED ORDER — INFLUENZA VAC A&B SA ADJ QUAD 0.5 ML IM PRSY
0.5000 mL | PREFILLED_SYRINGE | INTRAMUSCULAR | Status: AC
  Administered 2022-03-29: 0.5 mL via INTRAMUSCULAR
  Filled 2022-03-28: qty 0.5

## 2022-03-28 MED ORDER — CHLORHEXIDINE GLUCONATE 4 % EX LIQD: 60.0000 mL | Freq: Once | CUTANEOUS | Status: DC

## 2022-03-28 MED ORDER — CEFAZOLIN SODIUM-DEXTROSE 2-4 GM/100ML-% IV SOLN
2.0000 g | Freq: Four times a day (QID) | INTRAVENOUS | Status: AC
  Administered 2022-03-28 (×2): 2 g via INTRAVENOUS
  Filled 2022-03-28 (×3): qty 100

## 2022-03-28 MED ORDER — GABAPENTIN 300 MG PO CAPS: 300.0000 mg | ORAL_CAPSULE | Freq: Once | ORAL | Status: AC

## 2022-03-28 MED ORDER — CEFAZOLIN SODIUM-DEXTROSE 2-4 GM/100ML-% IV SOLN
2.0000 g | INTRAVENOUS | Status: AC
  Administered 2022-03-28: 2 g via INTRAVENOUS

## 2022-03-28 SURGICAL SUPPLY — 59 items
ACETAB CUP W/GRIPTION 54 (Plate) ×1 IMPLANT
BALL HIP ARTICU EZE 36 8.5 (Hips) IMPLANT
BLADE SAW 90X25X1.19 OSCILLAT (BLADE) ×1 IMPLANT
CUP ACETAB W/GRIPTION 54 (Plate) IMPLANT
DRAPE 3/4 80X56 (DRAPES) ×1 IMPLANT
DRAPE INCISE IOBAN 66X60 STRL (DRAPES) ×1 IMPLANT
DRSG DERMACEA NONADH 3X8 (GAUZE/BANDAGES/DRESSINGS) ×1 IMPLANT
DRSG MEPILEX SACRM 8.7X9.8 (GAUZE/BANDAGES/DRESSINGS) ×1 IMPLANT
DRSG OPSITE POSTOP 4X12 (GAUZE/BANDAGES/DRESSINGS) ×1 IMPLANT
DRSG OPSITE POSTOP 4X14 (GAUZE/BANDAGES/DRESSINGS) IMPLANT
DRSG TEGADERM 4X4.75 (GAUZE/BANDAGES/DRESSINGS) ×1 IMPLANT
DURAPREP 26ML APPLICATOR (WOUND CARE) ×2 IMPLANT
ELECT CAUTERY BLADE 6.4 (BLADE) ×1 IMPLANT
ELECT REM PT RETURN 9FT ADLT (ELECTROSURGICAL) ×1
ELECTRODE REM PT RTRN 9FT ADLT (ELECTROSURGICAL) ×1 IMPLANT
GLOVE BIO SURGEON STRL SZ7.5 (GLOVE) ×2 IMPLANT
GLOVE BIOGEL M STRL SZ7.5 (GLOVE) ×2 IMPLANT
GLOVE PI ORTHO PRO STRL 7.5 (GLOVE) ×2 IMPLANT
GLOVE SURG UNDER LTX SZ8 (GLOVE) ×1 IMPLANT
GLOVE SURG UNDER POLY LF SZ7.5 (GLOVE) ×1 IMPLANT
GOWN STRL REUS W/ TWL LRG LVL3 (GOWN DISPOSABLE) ×2 IMPLANT
GOWN STRL REUS W/ TWL XL LVL3 (GOWN DISPOSABLE) ×1 IMPLANT
GOWN STRL REUS W/TWL LRG LVL3 (GOWN DISPOSABLE) ×2
GOWN STRL REUS W/TWL XL LVL3 (GOWN DISPOSABLE) ×1
HEMOVAC 400CC 10FR (MISCELLANEOUS) ×1 IMPLANT
HIP BALL ARTICU EZE 36 8.5 (Hips) ×1 IMPLANT
HOLDER FOLEY CATH W/STRAP (MISCELLANEOUS) ×1 IMPLANT
HOLSTER ELECTROSUGICAL PENCIL (MISCELLANEOUS) ×2 IMPLANT
HOOD PEEL AWAY FLYTE STAYCOOL (MISCELLANEOUS) ×2 IMPLANT
IV NS IRRIG 3000ML ARTHROMATIC (IV SOLUTION) ×1 IMPLANT
KIT PEG BOARD PINK (KITS) ×1 IMPLANT
KIT TURNOVER KIT A (KITS) ×1 IMPLANT
LINER NEUTRAL 54X36MM PLUS 4 (Hips) IMPLANT
MANIFOLD NEPTUNE II (INSTRUMENTS) ×2 IMPLANT
NDL SAFETY ECLIP 18X1.5 (MISCELLANEOUS) ×1 IMPLANT
NS IRRIG 1000ML POUR BTL (IV SOLUTION) ×1 IMPLANT
NS IRRIG 500ML POUR BTL (IV SOLUTION) ×1 IMPLANT
PACK HIP PROSTHESIS (MISCELLANEOUS) ×1 IMPLANT
PENCIL SMOKE EVACUATOR COATED (MISCELLANEOUS) ×1 IMPLANT
PULSAVAC PLUS IRRIG FAN TIP (DISPOSABLE) ×1
SOL PREP PVP 2OZ (MISCELLANEOUS) ×1
SOLUTION IRRIG SURGIPHOR (IV SOLUTION) ×1 IMPLANT
SOLUTION PREP PVP 2OZ (MISCELLANEOUS) ×1 IMPLANT
SPONGE DRAIN TRACH 4X4 STRL 2S (GAUZE/BANDAGES/DRESSINGS) ×1 IMPLANT
STAPLER SKIN PROX 35W (STAPLE) ×1 IMPLANT
STEM FEM SZ3 STD ACTIS (Stem) IMPLANT
SUT ETHIBOND #5 BRAIDED 30INL (SUTURE) ×1 IMPLANT
SUT VIC AB 0 CT1 36 (SUTURE) ×1 IMPLANT
SUT VIC AB 1 CT1 36 (SUTURE) ×2 IMPLANT
SUT VIC AB 2-0 CT1 27 (SUTURE) ×1
SUT VIC AB 2-0 CT1 TAPERPNT 27 (SUTURE) ×1 IMPLANT
SYR 20ML LL LF (SYRINGE) ×1 IMPLANT
TAPE CLOTH 3X10 WHT NS LF (GAUZE/BANDAGES/DRESSINGS) ×1 IMPLANT
TAPE TRANSPORE STRL 2 31045 (GAUZE/BANDAGES/DRESSINGS) ×1 IMPLANT
TIP FAN IRRIG PULSAVAC PLUS (DISPOSABLE) ×1 IMPLANT
TRAP FLUID SMOKE EVACUATOR (MISCELLANEOUS) ×1 IMPLANT
TRAY FOLEY MTR SLVR 16FR STAT (SET/KITS/TRAYS/PACK) ×1 IMPLANT
TUBE KAMVAC SUCTION (TUBING) ×1 IMPLANT
WATER STERILE IRR 1000ML POUR (IV SOLUTION) ×1 IMPLANT

## 2022-03-28 NOTE — Evaluation (Signed)
Physical Therapy Evaluation Patient Details Name: Kristina Medina MRN: [id number] DOB: Oct 04, 1932 Today's Date: 03/28/2022  History of Present Illness  Kristina Medina is a 44yoF who comes to [facility] on 03/28/22 for elective Rt THA [place] Dr. Marry Guan. Postoperatively pt has posterior hip precautions and is WBAT. Of note pat has fairly advanced DJD of bilat knees as well, degenerative valgus in Rt knee that makes hip precautions adherance difficulty to visualize or modify at times.  Clinical Impression  Pt admitted with above Dx. Pt has functional limitations due to deficits below (see "PT Problem List"). Pt and niece able to provide details on baseline functional status, DC plan, home setup. Pt already in chair on arrival, appears comfortable, has already finished lunch. Niece at bedside who plans to assist after DC. MinGuardA SPT to [facility] for voiding, then rise to standing and AMB >75f. Pt assisted back to recliner, then SPT to EOB. MinA of RLE for return to supine from Lt EOB. Pt has a tendency to come fairly close to her 70 degrees hip flexion restriction, I suspect as a means to off load painful knee and move more load to hip extensors. Today pt requires minimal physical assistance to perform bed mobility, and supervision for cues for transfers and short distance walking. Patient's performance this date reveals decreased ability, independence, and tolerance in performing all basic mobility required for performance of activities of daily living. Pt requires additional DME, close physical assistance, and cues for safe participate in mobility. Pt will benefit from skilled PT intervention to increase independence and safety with basic mobility in preparation for discharge to the venue listed below.          Recommendations for follow up therapy are one component of a multi-disciplinary discharge planning process, led by the attending physician.  Recommendations may be updated based on patient status, additional  functional criteria and insurance authorization.  Follow Up Recommendations Follow physician's recommendations for discharge plan and follow up therapies (PACE participant, typically handle all rehab needs of patient)      Assistance Recommended at Discharge Intermittent Supervision/Assistance  Patient can return home with the following  A little help with walking and/or transfers;A lot of help with bathing/dressing/bathroom;Assistance with cooking/housework;Assist for transportation;Help with stairs or ramp for entrance    Equipment Recommendations Rolling walker (2 wheels)  Recommendations for Other Services       Functional Status Assessment Patient has had a recent decline in their functional status and demonstrates the ability to make significant improvements in function in a reasonable and predictable amount of time.     Precautions / Restrictions Precautions Precautions: Fall Restrictions Weight Bearing Restrictions: Yes RLE Weight Bearing: Weight bearing as tolerated      Mobility  Bed Mobility Overal bed mobility: Needs Assistance Bed Mobility: Sit to Supine       Sit to supine: Min assist   General bed mobility comments: MinA of Rt leg and cues to meet precautions (from Left EOB)    Transfers Overall transfer level: Needs assistance Equipment used: Rolling walker (2 wheels) Transfers: Sit to/from Stand, Bed to chair/wheelchair/BSC Sit to Stand: Min guard Stand pivot transfers: Supervision         General transfer comment: labored with difficulty transitioning hand from armrest to RW on Rt due to balance challenge; multimodal cues for kicking surgical foot out forward to reduce hip flexion angle.    Ambulation/Gait Ambulation/Gait assistance: Min guard Gait Distance (Feet): 28 Feet Assistive device: Rolling walker (2 wheels) Gait  Pattern/deviations: WFL(Within Functional Limits), Step-to pattern       General Gait Details: very slow, but consisently  paced AMB, reports to feel good throughout, denies lightheadedness; distance ultimately limited due to room not being staged for AMB fully out of room.  Stairs            Wheelchair Mobility    Modified Rankin (Stroke Patients Only)       Balance                                             Pertinent Vitals/Pain Pain Assessment Pain Assessment: 0-10 Pain Score: 3  Pain Location: Rt knee Pain Intervention(s): Limited activity within patient's tolerance, Monitored during session, Premedicated before session, Repositioned    Home Living Family/patient expects to be discharged to:: Private residence [facility] Kristina Medina) Living Arrangements: Alone Available Help at Discharge: Family Type of Home: [place] Home Access: Ramped entrance       Home Layout: One level Home Equipment: Rollator (4 wheels);Tub bench Additional Comments: [facility] [employer]- goes Tuesdays and Thursdays (works with [facility] at [facility])    Prior Function Prior Level of Function : Independent/Modified Independent               ADLs Comments: has an aide in PM that helps with bathing     Hand Dominance        Extremity/Trunk Assessment   Upper Extremity Assessment Upper Extremity Assessment: Overall WFL for tasks assessed;Generalized weakness    Lower Extremity Assessment Lower Extremity Assessment: Overall WFL for tasks assessed;Generalized weakness       Communication      Cognition Arousal/Alertness: Awake/alert Behavior During Therapy: WFL for tasks assessed/performed Overall Cognitive Status: Within Functional Limits for tasks assessed                                          General Comments      Exercises Other Exercises Other Exercises: Education on hip precautions: flexion and ADDuction, directed heavily toward Niece Robin   Assessment/Plan    PT Assessment Patient needs continued PT services  PT Problem List Decreased  strength;Decreased range of motion;Decreased activity tolerance;Decreased balance;Decreased mobility;Decreased coordination;Decreased safety awareness;Decreased knowledge of precautions;Decreased knowledge of use of DME       PT Treatment Interventions DME instruction;Balance training;Gait training;Stair training;Functional mobility training;Therapeutic activities;Therapeutic exercise;Patient/family education    PT Goals (Current goals can be found in the Care Plan section)  Acute Rehab PT Goals Patient Stated Goal: Regain AMB and independence for living at home PT Goal Formulation: With patient Time For Goal Achievement: 04/11/22 Potential to Achieve Goals: Good    Frequency BID     Co-evaluation               AM-PAC PT "6 Clicks" Mobility  Outcome Measure Help needed turning from your back to your side while in a flat bed without using bedrails?: A Lot Help needed moving from lying on your back to sitting on the side of a flat bed without using bedrails?: A Lot Help needed moving to and from a bed to a chair (including a wheelchair)?: A Little Help needed standing up from a chair using your arms (e.g., wheelchair or bedside chair)?: A Little Help needed to walk in  hospital room?: A Little Help needed climbing 3-5 steps with a railing? : A Lot 6 Click Score: 15    End of Session Equipment Utilized During Treatment: Gait belt Activity Tolerance: Patient tolerated treatment well;No increased pain Patient left: in bed;with family/visitor present;with call bell/phone within reach;with SCD's reapplied Nurse Communication: Mobility status PT Visit Diagnosis: Difficulty in walking, not elsewhere classified (R26.2);Unsteadiness on feet (R26.81);Muscle weakness (generalized) (M62.81)    Time: 4832-3468 PT Time Calculation (min) (ACUTE ONLY): 40 min   Charges:   PT Evaluation $PT Eval Moderate Complexity: 1 Mod PT Treatments $Gait Training: 8-22 mins $Therapeutic Activity:  8-22 mins       4:07 PM, 03/28/22 Etta Grandchild, PT, DPT Physical Therapist - [facility]  [phone] ([place])    Kristina Medina 03/28/2022, 4:00 PM

## 2022-03-28 NOTE — Anesthesia Preprocedure Evaluation (Signed)
Anesthesia Evaluation  Patient identified by MRN, date of birth, ID band Patient awake    Reviewed: Allergy & Precautions, NPO status , Patient's Chart, lab work & pertinent test results  History of Anesthesia Complications (+) PONV and history of anesthetic complications  Airway Mallampati: II       Dental  (+) Edentulous Upper, Edentulous Lower, Dental Advidsory Given   Pulmonary neg shortness of breath, neg sleep apnea, COPD (mild), neg recent URI, former smoker,           Cardiovascular hypertension, Pt. on medications (-) angina(-) Past MI, (-) Cardiac Stents and (-) CHF (-) dysrhythmias (-) Valvular Problems/Murmurs     Neuro/Psych neg Seizures    GI/Hepatic Neg liver ROS, GERD  Medicated and Controlled,  Endo/Other  neg diabetes  Renal/GU negative Renal ROS     Musculoskeletal   Abdominal   Peds  Hematology   Anesthesia Other Findings Past Medical History: No date: Anemia     Comment:  h/o No date: Arthritis 01/06/2017: Breast cancer ([place])     Comment:  right breast 01/31/2017: Cancer ([place])     Comment:  right breast ER< 10 %; PR: Neg.Nodes negative x 4. 09/2019: Cataract No date: Complication of anesthesia No date: COPD (chronic obstructive pulmonary disease) (HCC) No date: GERD (gastroesophageal reflux disease) No date: History of kidney stones No date: Hypertension No date: Irregular heart beat No date: Osteoporosis No date: Patient is Jehovah's Witness No date: Personal history of radiation therapy No date: PONV (postoperative nausea and vomiting)     Comment:  WITH GALLBLADDER SURGERY 1966: S/P right breast biopsy   Reproductive/Obstetrics                             Anesthesia Physical  Anesthesia Plan  ASA: 3  Anesthesia Plan: Spinal   Post-op Pain Management:    Induction:   PONV Risk Score and Plan: 4 or greater and Propofol infusion and TIVA  Airway  Management Planned: Natural Airway and Simple Face Mask  Additional Equipment:   Intra-op Plan:   Post-operative Plan:   Informed Consent: I have reviewed the patients History and Physical, chart, labs and discussed the procedure including the risks, benefits and alternatives for the proposed anesthesia with the patient or authorized representative who has indicated his/her understanding and acceptance.       Plan Discussed with:   Anesthesia Plan Comments:         Anesthesia Quick Evaluation

## 2022-03-28 NOTE — Interval H&P Note (Signed)
History and Physical Interval Note:  03/28/2022 6:12 AM  Kristina Medina  has presented today for surgery, with the diagnosis of PRIMARY OSTEOARTHRITIS OF RIGHT HIP.Marland Kitchen  The various methods of treatment have been discussed with the patient and family. After consideration of risks, benefits and other options for treatment, the patient has consented to  Procedure(s): TOTAL HIP ARTHROPLASTY (Right) as a surgical intervention.  The patient's history has been reviewed, patient examined, no change in status, stable for surgery.  I have reviewed the patient's chart and labs.  Questions were answered to the patient's satisfaction.     [place]

## 2022-03-28 NOTE — Progress Notes (Signed)
Patient tolerated po fluids without event.

## 2022-03-28 NOTE — Transfer of Care (Signed)
Immediate Anesthesia Transfer of Care Note  Patient: Kristina Medina  Procedure(s) Performed: TOTAL HIP ARTHROPLASTY (Right: Hip)  Patient Location: PACU  Anesthesia Type:General and Spinal  Level of Consciousness: drowsy  Airway & Oxygen Therapy: Patient Spontanous Breathing  Post-op Assessment: Report given to RN and Post -op Vital signs reviewed and stable  Post vital signs: Reviewed and stable  Last Vitals:  Vitals Value Taken Time  BP 133/60 03/28/22 1053  Temp    Pulse 74 03/28/22 1058  Resp 22 03/28/22 1058  SpO2 99 % 03/28/22 1058  Vitals shown include unvalidated device data.  Last Pain:  Vitals:   03/28/22 0650  TempSrc: Temporal  PainSc: 0-No pain         Complications: No notable events documented.

## 2022-03-28 NOTE — Op Note (Signed)
OPERATIVE NOTE  DATE OF SURGERY:  03/28/2022  PATIENT NAME:  Kristina Medina   DOB: March 22, 1933  MRN: [id number]  PRE-OPERATIVE DIAGNOSIS: Degenerative arthrosis of the right hip, primary  POST-OPERATIVE DIAGNOSIS:  Same  PROCEDURE:  Right total hip arthroplasty  SURGEON:  Marciano Sequin. M.D.  ASSISTANT: Cassell Smiles, PA-C (present and scrubbed throughout the case, critical for assistance with exposure, retraction, instrumentation, and closure)  ANESTHESIA: spinal  ESTIMATED BLOOD LOSS: 50 mL  FLUIDS REPLACED: 1000 mL of crystalloid  DRAINS: 2 medium Hemovac drains  IMPLANTS UTILIZED: DePuy size 3 standard offset Actis femoral stem, 54 mm OD Pinnacle GRIPTION Sector acetabular component, +4 mm neutral Pinnacle Altrx polyethylene insert, and a 36 mm M-SPEC +8.5 mm hip ball  INDICATIONS FOR SURGERY: Kristina Medina is a 88 y.o. year old female with a long history of progressive hip and groin  pain. X-rays demonstrated severe degenerative changes. The patient had not seen any significant improvement despite conservative nonsurgical intervention. After discussion of the risks and benefits of surgical intervention, the patient expressed understanding of the risks benefits and agree with plans for total hip arthroplasty.   The risks, benefits, and alternatives were discussed at length including but not limited to the risks of infection, bleeding, nerve injury, stiffness, blood clots, the need for revision surgery, limb length inequality, dislocation, cardiopulmonary complications, among others, and they were willing to proceed.  PROCEDURE IN DETAIL: The patient was brought into the operating room and, after adequate spinal anesthesia was achieved, the patient was placed in a left lateral decubitus position. Axillary roll was placed and all bony prominences were well-padded. The patient's right hip was cleaned and prepped with alcohol and DuraPrep and draped in the usual sterile fashion. A  "timeout" was performed as per usual protocol. A lateral curvilinear incision was made gently curving towards the posterior superior iliac spine. The IT band was incised in line with the skin incision and the fibers of the gluteus maximus were split in line. The piriformis tendon was identified, skeletonized, and incised at its insertion to the proximal femur and reflected posteriorly. A T type posterior capsulotomy was performed. Prior to dislocation of the femoral head, a threaded Steinmann pin was inserted through a separate stab incision into the pelvis superior to the acetabulum and bent in the form of a stylus so as to assess limb length and hip offset throughout the procedure. The femoral head was then dislocated posteriorly. Inspection of the femoral head demonstrated severe degenerative changes with full-thickness loss of articular cartilage. The femoral neck cut was performed using an oscillating saw. The anterior capsule was elevated off of the femoral neck using a periosteal elevator. Attention was then directed to the acetabulum. The remnant of the labrum was excised using electrocautery. Inspection of the acetabulum also demonstrated significant degenerative changes. The acetabulum was reamed in sequential fashion up to a 53 mm diameter. Good punctate bleeding bone was encountered. A 54 mm Pinnacle GRIPTION Sector acetabular component was positioned and impacted into place. Good scratch fit was appreciated. A +4 mm neutral polyethylene trial was inserted.  Attention was then directed to the proximal femur.  Femoral broaches were inserted in a sequential fashion up to a size 3 broach. Calcar region was planed and a trial reduction was performed using a standard offset neck and a 36 mm hip ball with a +8.5 mm neck length. Good equalization of limb lengths and hip offset was appreciated and excellent stability was noted both anteriorly  and posteriorly. Trial components were removed. The acetabular shell  was irrigated with copious amounts of normal saline with antibiotic solution and suctioned dry. A +4 mm Pinnacle Altrx polyethylene insert was positioned and impacted into place. Next, a size 3 standard offset Actis femoral stem was positioned and impacted into place. Excellent scratch fit was appreciated. A trial reduction was again performed with a 36 mm hip ball with a +8.5 mm neck length. Again, good equalization of limb lengths was appreciated and excellent stability appreciated both anteriorly and posteriorly. The hip was then dislocated and the trial hip ball was removed. The Morse taper was cleaned and dried. A 36 mm M-SPEC hip ball with a +8.5 mm neck length was placed on the trunnion and impacted into place. The hip was then reduced and placed through range of motion. Excellent stability was appreciated both anteriorly and posteriorly.  The wound was irrigated with copious amounts of normal saline followed by 450 ml of Surgiphor and suctioned dry. Good hemostasis was appreciated. The posterior capsulotomy was repaired using #5 Ethibond. Piriformis tendon was reapproximated to the undersurface of the gluteus medius tendon using #5 Ethibond. The IT band was reapproximated using interrupted sutures of #1 Vicryl. Subcutaneous tissue was approximated using first #0 Vicryl followed by #2-0 Vicryl. The skin was closed with skin staples.  The patient tolerated the procedure well and was transported to the recovery room in stable condition.   Marciano Sequin., M.D.

## 2022-03-28 NOTE — Anesthesia Procedure Notes (Addendum)
Spinal  Patient location during procedure: OR Start time: 03/28/2022 7:27 AM End time: 03/28/2022 7:54 AM Reason for block: surgical anesthesia Staffing Performed: anesthesiologist and resident/CRNA  Anesthesiologist: Martha Clan, MD Resident/CRNA: Lia Foyer, CRNA Performed by: Lia Foyer, CRNA Authorized by: Martha Clan, MD   Preanesthetic Checklist Completed: patient identified, IV checked, site marked, risks and benefits discussed, surgical consent, monitors and equipment checked, pre-op evaluation and timeout performed Spinal Block Patient position: sitting Prep: ChloraPrep Patient monitoring: heart rate, cardiac monitor, continuous pulse ox and blood pressure Approach: right paramedian Location: L4-5 Injection technique: single-shot Needle Needle type: Sprotte  Needle gauge: 24 G Needle length: 9 cm Assessment Sensory level: T8 Events: CSF return Additional Notes IV functioning, monitors applied to pt. Expiration date of kit checked and confirmed to be in date. Sterile prep and drape, hand hygiene and sterile gloved used. Pt was positioned and spine was prepped in sterile fashion. Skin was anesthetized with lidocaine. Free flow of clear CSF obtained prior to injecting local anesthetic into CSF x 5 attempt. Spinal needle aspirated freely following injection. Needle was carefully withdrawn, and pt tolerated procedure well. Loss of motor and sensory on exam post injection.

## 2022-03-28 NOTE — Progress Notes (Signed)
Patient awake/alert x4. Able to wiggle toes bil. Vague sensation intact.  Indwelling foley catheter removed at 1049.  Will continue to monitor closely.

## 2022-03-29 ENCOUNTER — Encounter: Payer: Self-pay | Admitting: Orthopedic Surgery

## 2022-03-29 MED ORDER — OXYCODONE HCL 5 MG PO TABS: 5.0000 mg | ORAL_TABLET | ORAL | 0 refills | Status: AC | PRN

## 2022-03-29 MED ORDER — TRAMADOL HCL 50 MG PO TABS: 50.0000 mg | ORAL_TABLET | ORAL | 0 refills | Status: AC | PRN

## 2022-03-29 MED ORDER — CELECOXIB 200 MG PO CAPS: 200.0000 mg | ORAL_CAPSULE | Freq: Two times a day (BID) | ORAL | 0 refills | Status: AC

## 2022-03-29 MED ORDER — ENOXAPARIN SODIUM 40 MG/0.4ML IJ SOSY: 40.0000 mg | PREFILLED_SYRINGE | INTRAMUSCULAR | 0 refills | Status: AC

## 2022-03-29 NOTE — Plan of Care (Signed)

## 2022-03-29 NOTE — Discharge Summary (Signed)
Physician Discharge Summary  Patient ID: Kristina Medina MRN: [id number] DOB/AGE: 28-Jun-1932 88 y.o.  Admit date: 03/28/2022 Discharge date: 03/29/2022  Admission Diagnoses:  Hx of total hip arthroplasty, right [Z96.641]  Surgeries:Procedure(s):  Right total hip arthroplasty   SURGEON:  Marciano Sequin. M.D.   ASSISTANT: Cassell Smiles, PA-C (present and scrubbed throughout the case, critical for assistance with exposure, retraction, instrumentation, and closure)   ANESTHESIA: spinal   ESTIMATED BLOOD LOSS: 50 mL   FLUIDS REPLACED: 1000 mL of crystalloid   DRAINS: 2 medium Hemovac drains   IMPLANTS UTILIZED: DePuy size 3 standard offset Actis femoral stem, 54 mm OD Pinnacle GRIPTION Sector acetabular component, +4 mm neutral Pinnacle Altrx polyethylene insert, and a 36 mm M-SPEC +8.5 mm hip ball  Discharge Diagnoses: Patient Active Problem List   Diagnosis Date Noted   Hx of total hip arthroplasty, right 03/28/2022   GERD (gastroesophageal reflux disease) 03/27/2022   Hypertension 03/27/2022   Osteoporosis 03/27/2022   Rheumatoid arthritis ([place]) 03/27/2022   Ex-smoker 08/29/2017   History of radiation therapy 08/29/2017   Seroma of breast 03/10/2017   18 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 ER< 10 %; PR: Neg.  01/16/2017   Rheumatoid arthritis involving right hip with positive rheumatoid factor ([place]) 04/18/2016    Past Medical History:  Diagnosis Date   Anemia    h/o   Arthritis    Breast cancer ([place]) 01/06/2017   right breast   Cancer ([place]) 01/31/2017   right breast ER< 10 %; PR: Neg.Nodes negative x 4.   Cataract 86/7619   Complication of anesthesia    COPD (chronic obstructive pulmonary disease) (HCC)    GERD (gastroesophageal reflux disease)    History of kidney stones    Hypertension    Irregular heart beat    Osteoporosis    Patient is Jehovah's Witness    Personal history of radiation therapy    PONV (postoperative nausea and vomiting)     WITH GALLBLADDER SURGERY   S/P right breast biopsy 1966     Transfusion:    Consultants (if any):   Discharged Condition: Improved  Hospital Course: Kristina Medina is an 88 y.o. female who was admitted 03/28/2022 with a diagnosis of right hip osteoarthritis and went to the operating room on 03/28/2022 and underwent right total hip arthroplasty through posterior approach. The patient received perioperative antibiotics for prophylaxis (see below). The patient tolerated the procedure well and was transported to PACU in stable condition. After meeting PACU criteria, the patient was subsequently transferred to the Orthopaedics/Rehabilitation unit.   The patient received DVT prophylaxis in the form of early mobilization, Lovenox, TED hose, and SCDs . A sacral pad had been placed and heels were elevated off of the bed with rolled towels in order to protect skin integrity. Foley catheter was discontinued on postoperative day #0. Wound drains were discontinued on postoperative day #1. The surgical incision was healing well without signs of infection.  Physical therapy was initiated postoperatively for transfers, gait training, and strengthening. Occupational therapy was initiated for activities of daily living and evaluation for assisted devices. Rehabilitation goals were reviewed in detail with the patient. The patient made steady progress with physical therapy and physical therapy recommended discharge to Home.   The patient achieved the preliminary goals of this hospitalization and was felt to be medically and orthopaedically appropriate for discharge.  She was given perioperative antibiotics:  Anti-infectives (From admission, onward)    Start  Dose/Rate Route Frequency Ordered Stop   03/28/22 1400  ceFAZolin (ANCEF) IVPB 2g/100 mL premix        2 g 200 mL/hr over 30 Minutes Intravenous Every 6 hours 03/28/22 1149 03/29/22 0742   03/28/22 0636  ceFAZolin (ANCEF) 2-4 GM/100ML-% IVPB        Note to Pharmacy: Jeanene Erb E: cabinet override      03/28/22 0636 03/28/22 0805   03/28/22 0600  ceFAZolin (ANCEF) IVPB 2g/100 mL premix        2 g 200 mL/hr over 30 Minutes Intravenous On call to O.R. 03/28/22 0053 03/28/22 0800     .  Recent vital signs:  Vitals:   03/29/22 0816 03/29/22 1525  BP: (!) 170/76 134/69  Pulse: 87 87  Resp: 16 16  Temp: 98.2 F (36.8 C) 98.6 F (37 C)  SpO2: 99% 100%    Recent laboratory studies:  No results for input(s): "WBC", "HGB", "HCT", "PLT", "K", "CL", "CO2", "BUN", "CREATININE", "GLUCOSE", "CALCIUM", "LABPT", "INR" in the last 72 hours.  Diagnostic Studies: DG Hip Port Unilat With Pelvis 1V Right  Result Date: 03/28/2022 CLINICAL DATA:  Status post right total hip arthroplasty. EXAM: DG HIP (WITH OR WITHOUT PELVIS) 1V PORT RIGHT COMPARISON:  CT abdomen pelvis February 24, 2011 FINDINGS: Postsurgical changes compatible with right hip arthroplasty. No definite acute process. Lumbar spine degenerative changes. IMPRESSION: Postsurgical changes compatible with right hip arthroplasty. Electronically Signed   By: Lovey Newcomer M.D.   On: 03/28/2022 11:23    Discharge Medications:   Allergies as of 03/29/2022       Reactions   Hydroxyquinolines Diarrhea   headache   Sulfasalazine Other (See Comments)   headache        Medication List     STOP taking these medications    aspirin EC 81 MG tablet       TAKE these medications    acetaminophen 500 MG tablet Commonly known as: TYLENOL Take 1,000 mg by mouth 2 (two) times daily as needed.   amLODipine 5 MG tablet Commonly known as: NORVASC Take 5 mg by mouth every morning. Takes in combination with 2.5 mg Norvasc   amLODipine 2.5 MG tablet Commonly known as: NORVASC Take 2.5 mg by mouth every morning. Takes in combination with 5 mg Norvasc   CALCIUM 600+D3 PO Take 1 tablet by mouth 2 (two) times daily.   celecoxib 200 MG capsule Commonly known as: CELEBREX Take 1  capsule (200 mg total) by mouth 2 (two) times daily.   CENTRUM MINIS WOMEN 50+ PO Take 1 tablet by mouth daily.   cetirizine 10 MG tablet Commonly known as: ZYRTEC Take 10 mg by mouth at bedtime.   cloNIDine 0.1 mg/24hr patch Commonly known as: CATAPRES - Dosed in mg/24 hr Place 0.1 mg onto the skin once a week. Applies on Wednesdays   diclofenac Sodium 1 % Gel Commonly known as: VOLTAREN Apply 2-4 g topically 3 (three) times daily. Apply 4 gm to each knee and 2 g to right shoulder   enoxaparin 40 MG/0.4ML injection Commonly known as: LOVENOX Inject 0.4 mLs (40 mg total) into the skin daily for 14 days.   famotidine 40 MG tablet Commonly known as: PEPCID Take 40 mg by mouth every morning.   folic acid 1 MG tablet Commonly known as: FOLVITE Take 1 mg by mouth daily.   methotrexate 2.5 MG tablet Take 25 mg by mouth once a week. Takes on Thursday-10 tablets   mometasone  50 MCG/ACT nasal spray Commonly known as: NASONEX Place 2 sprays into the nose as needed.   montelukast 10 MG tablet Commonly known as: SINGULAIR Take 10 mg by mouth at bedtime.   olmesartan 40 MG tablet Commonly known as: BENICAR Take 40 mg by mouth every morning.   oxyCODONE 5 MG immediate release tablet Commonly known as: Oxy IR/ROXICODONE Take 1 tablet (5 mg total) by mouth every 4 (four) hours as needed for severe pain.   traMADol 50 MG tablet Commonly known as: ULTRAM Take 1 tablet (50 mg total) by mouth every 4 (four) hours as needed for moderate pain.   traZODone 50 MG tablet Commonly known as: DESYREL Take 50 mg by mouth at bedtime.               Durable Medical Equipment  (From admission, onward)           Start     Ordered   03/28/22 1150  DME Walker rolling  Once       Question:  Patient needs a walker to treat with the following condition  Answer:  S/P total hip arthroplasty   03/28/22 1149   03/28/22 1150  DME Bedside commode  Once       Question:  Patient needs a  bedside commode to treat with the following condition  Answer:  S/P total hip arthroplasty   03/28/22 1149            Disposition: Home with outpatient PT at [facility], Laurice Record, MD Follow up on 05/10/2022.   Specialty: Orthopedic Surgery Why: at 1:30pm Contact information: [place] [address] [place], PA-C 03/29/2022, 5:01 PM

## 2022-03-29 NOTE — Progress Notes (Signed)
[facility] is followed by [facility],  They provide PT [facility] provides all DME [facility] will provide transportation

## 2022-03-29 NOTE — Progress Notes (Signed)
  Subjective: 1 Day Post-Op Procedure(s) (LRB): TOTAL HIP ARTHROPLASTY (Right) Patient reports pain as well-controlled.  Niece at bedside. Patient is well, and has had no acute complaints or problems Plan is to go Home after hospital stay. Negative for chest pain and shortness of breath Fever: no Gastrointestinal: negative for nausea and vomiting.   Patient has not had a bowel movement.  Objective: Vital signs in last 24 hours: Temp:  [94.4 F (34.7 C)-98.2 F (36.8 C)] 98.2 F (36.8 C) (10/10 0816) Pulse Rate:  [66-90] 87 (10/10 0816) Resp:  [16-23] 16 (10/10 0816) BP: (128-179)/(58-76) 170/76 (10/10 0816) SpO2:  [98 %-100 %] 99 % (10/10 0816)  Intake/Output from previous day:  Intake/Output Summary (Last 24 hours) at 03/29/2022 0835 Last data filed at 03/29/2022 0351 Gross per 24 hour  Intake 1307.43 ml  Output 795 ml  Net 512.43 ml    Intake/Output this shift: No intake/output data recorded.  Labs: No results for input(s): "HGB" in the last 72 hours. No results for input(s): "WBC", "RBC", "HCT", "PLT" in the last 72 hours. No results for input(s): "NA", "K", "CL", "CO2", "BUN", "CREATININE", "GLUCOSE", "CALCIUM" in the last 72 hours. No results for input(s): "LABPT", "INR" in the last 72 hours.   EXAM General - Patient is Alert, Appropriate, and Oriented Extremity - Neurovascular intact Dorsiflexion/Plantar flexion intact Compartment soft Dressing/Incision -clean, dry, Hemovac in place.  Motor Function - intact, moving foot and toes well on exam.   Cardiovascular- Regular rate and rhythm, no murmurs/rubs/gallops Respiratory- Lungs clear to auscultation bilaterally Gastrointestinal- soft, nontender, and active bowel sounds   Assessment/Plan: 1 Day Post-Op Procedure(s) (LRB): TOTAL HIP ARTHROPLASTY (Right) Principal Problem:   Hx of total hip arthroplasty, right  Estimated body mass index is 20.89 kg/m as calculated from the following:   Height as of this  encounter: '5\' 3"'$  (1.6 m).   Weight as of this encounter: 53.5 kg. Advance diet Up with therapy  Anticipate d/c later this PM pending completion of therapy goals.   Hemovac removed.  DVT Prophylaxis - Lovenox, Ted hose, and SCDs Weight-Bearing as tolerated to right leg  Cassell Smiles, PA-C [facility] Orthopaedic Surgery 03/29/2022, 8:35 AM

## 2022-03-29 NOTE — Evaluation (Signed)
Occupational Therapy Evaluation Patient Details Name: Kristina Medina MRN: [id number] DOB: September 01, 1932 Today's Date: 03/29/2022   History of Present Illness Kristina Medina is a 75yoF who comes to [facility] on 03/28/22 for elective Rt THA c Dr. Marry Guan. Postoperatively pt has posterior hip precautions and is WBAT. Of note pat has fairly advanced DJD of bilat knees as well, degenerative valgus in Rt knee that makes hip precautions adherance difficulty to visualize or modify at times.   Clinical Impression   Pt seen for OT evaluation this date, POD#1 from above surgery. Pt was largely independent in ADL prior to surgery, except had an aide who assisted with bathing and cleaning in the afternoons and niece and [facility] who assisted with IADL. Pt is eager to return to PLOF with less pain and improved safety and independence. Pt requires MIN A For LB ADL tasks, supv-CGA + VC for precautions, CGA in standing for standing pericare and clothing mgt, MAX A for compression stocking mgt, and CGA for ADL mobility with RW; pt required ~25-50% VC for precautions. Pt able to recall 1/3 posterior total hip precautions at start of session and unable to verbalize how to implement during ADL and mobility. At end of session, pt able to recall 2/3 posterior total hip precautions. Pt/niece instructed in posterior hip precautions and how to maintain during ADL/mobility, AE/DME, falls prevention, compression stocking mgt, car transfers, and home/routines modifications. Handout provided to support recall and carryover. Pt/niece verbalized understanding. Pt would benefit from additional instruction in self care skills and techniques to help maintain precautions with or without assistive devices to support recall and carryover prior to discharge. Recommend HHOT through [facility] upon discharge.       Recommendations for follow up therapy are one component of a multi-disciplinary discharge planning process, led by the attending physician.   Recommendations may be updated based on patient status, additional functional criteria and insurance authorization.   Follow Up Recommendations  Home health OT    Assistance Recommended at Discharge Intermittent Supervision/Assistance  Patient can return home with the following A little help with walking and/or transfers;A little help with bathing/dressing/bathroom;Assistance with cooking/housework;Assist for transportation;Help with stairs or ramp for entrance    Functional Status Assessment  Patient has had a recent decline in their functional status and demonstrates the ability to make significant improvements in function in a reasonable and predictable amount of time.  Equipment Recommendations  BSC/3in1;Other (comment) (LH sponge, reacher; spoke with [facility] who confirmed they will provide all needed DME/AE.)    Recommendations for Other Services       Precautions / Restrictions Precautions Precautions: Fall Restrictions Weight Bearing Restrictions: Yes RLE Weight Bearing: Weight bearing as tolerated      Mobility Bed Mobility               General bed mobility comments: NT up in recliner    Transfers Overall transfer level: Needs assistance Equipment used: Rolling walker (2 wheels) Transfers: Sit to/from Stand Sit to Stand: Min guard, Supervision                  Balance Overall balance assessment: Modified Independent                                         ADL either performed or assessed with clinical judgement   ADL  General ADL Comments: Pt requires MIN A For LB ADL tasks, supv-CGA + VC for precautions, CGA in standing for standing pericare and clothing mgt, MAX A for compression stocking mgt, and CGA for ADL mobility with RW; pt required ~25-50% VC for precautions.     Vision         Perception     Praxis      Pertinent Vitals/Pain Pain Assessment Pain Assessment:  0-10 Pain Score: 3  Pain Location: Rt knee/hip Pain Descriptors / Indicators: Aching Pain Intervention(s): Limited activity within patient's tolerance, Monitored during session, Premedicated before session, Repositioned     Hand Dominance     Extremity/Trunk Assessment Upper Extremity Assessment Upper Extremity Assessment: Overall WFL for tasks assessed;Generalized weakness   Lower Extremity Assessment Lower Extremity Assessment: Overall WFL for tasks assessed;Generalized weakness;RLE deficits/detail RLE Deficits / Details: expected post-op strength/ROM deficits       Communication     Cognition Arousal/Alertness: Awake/alert Behavior During Therapy: WFL for tasks assessed/performed Overall Cognitive Status: Within Functional Limits for tasks assessed                                 General Comments: grossly WFL, decreased recall of precautions but follows commands well     General Comments       Exercises Other Exercises Other Exercises: Pt/niece instructed in posterior hip precautions and how to maintain during ADL/mobility, AE/DME, falls prevention, compression stocking mgt, car transfers, and home/routines modifications. Handout provided to support recall and carryover.   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[employer])      Prior Functioning/Environment Prior Level of Function : Needs assist               ADLs Comments: has an aide in PM that  helps with bathing and cleaning, niece assists with groceries, and [facility] provides transportation        OT Problem List: Decreased strength;Pain;Decreased range of motion;Decreased knowledge of use of DME or AE;Decreased knowledge of precautions      OT Treatment/Interventions: Therapeutic exercise;Self-care/ADL training;Therapeutic activities;DME and/or AE instruction;Patient/family education;Balance training    OT Goals(Current goals can be found in the care plan section) Acute Rehab OT Goals Patient Stated Goal: get better and go home OT Goal Formulation: With patient/family Time For Goal Achievement: 04/12/22 Potential to Achieve Goals: Good ADL Goals Pt Will Perform Lower Body Dressing: with modified independence;with adaptive equipment;sit to/from stand;with caregiver independent in assisting (maintaining precautions) Pt Will Transfer to Toilet: with modified independence;ambulating (maintaining precautions, LRAD) Pt Will Perform Toileting - Clothing Manipulation and hygiene: with modified independence (maintaining precautions) Additional ADL Goal #1: Pt will verbalize 100% of posterior THPs and how to maintain during ADL/mobility with <25% multimodal cues for recall, 5/5 opportunities. Additional ADL Goal #2: Pt will complete all aspects of bathign, primarily from seated position, while maintaining precautions throughout, AE vs caregiver assist for LB bathing, and PRN VC for precautions/safety, 2/2 opportunities.  OT Frequency: Min 2X/week    Co-evaluation              AM-PAC OT "6 Clicks" Daily Activity     Outcome Measure Help from another person eating meals?: None Help from another person taking care of personal grooming?: A Little Help from another person toileting, which includes using toliet, bedpan, or urinal?: A Little Help from another person bathing (including washing, rinsing, drying)?: A Little Help from another person to put on and taking off regular upper body  clothing?: A Little Help from another person to put on and taking off regular lower body clothing?: A Little 6 Click Score: 19   End of Session Equipment Utilized During Treatment: Rolling walker (2 wheels) Nurse Communication: Mobility status  Activity Tolerance: Patient tolerated treatment well Patient left: in chair;with call bell/phone within reach;with family/visitor present  OT Visit Diagnosis: Other abnormalities of gait and mobility (R26.89);Muscle weakness (generalized) (M62.81);Pain Pain - Right/Left: Right Pain - part of body: Hip                Time: 7615-1834 OT Time Calculation (min): 32 min Charges:  OT General Charges $OT Visit: 1 Visit OT Evaluation $OT Eval Moderate Complexity: 1 Mod OT Treatments $Self Care/Home Management : 23-37 mins  Ardeth Perfect., MPH, MS, OTR/L ascom [phone] 03/29/22, 1:55 PM

## 2022-03-29 NOTE — Progress Notes (Signed)
Physical Therapy Treatment Patient Details Name: Kristina Medina MRN: [id number] DOB: [birth year] Today's Date: 03/29/2022   History of Present Illness Kristina Medina is a 55yoF who comes to [facility] on 03/28/22 for elective Rt THA c Dr. Marry Guan. Postoperatively pt has posterior hip precautions and is WBAT. Of note pat has fairly advanced DJD of bilat knees as well, degenerative valgus in Rt knee that makes hip precautions adherance difficulty to visualize or modify at times.    PT Comments    Pt still in chair, pain well controlled. Agreeable to HEP review with niece in attendance. Pt transferring and AMB at supervision level, no LOB. Pt able to go to supine from Rt EOB at supervision level. Pt able to perform 5 reps of each exercises without any need for self assist of surgical limb, without any c/o pain. Pt ready for DC from PT standpoint. Caregiver reports confidence and preparedness to care for patient.     Recommendations for follow up therapy are one component of a multi-disciplinary discharge planning process, led by the attending physician.  Recommendations may be updated based on patient status, additional functional criteria and insurance authorization.  Follow Up Recommendations  Follow physician's recommendations for discharge plan and follow up therapies     Assistance Recommended at Discharge Intermittent Supervision/Assistance  Patient can return home with the following A little help with walking and/or transfers;A lot of help with bathing/dressing/bathroom;Assistance with cooking/housework;Assist for transportation;Help with stairs or ramp for entrance   Equipment Recommendations  Rolling walker (2 wheels)    Recommendations for Other Services       Precautions / Restrictions Precautions Precautions: Fall Restrictions Weight Bearing Restrictions: Yes RLE Weight Bearing: Weight bearing as tolerated     Mobility  Bed Mobility Overal bed mobility: Needs Assistance Bed  Mobility: Sit to Supine       Sit to supine: Supervision   General bed mobility comments: from Rt EOB, no assist needed    Transfers Overall transfer level: Needs assistance Equipment used: Rolling walker (2 wheels) Transfers: Sit to/from Stand Sit to Stand: Supervision           General transfer comment: still close to 70 degrees when transfering    Ambulation/Gait Ambulation/Gait assistance: Supervision Gait Distance (Feet): 120 Feet Assistive device: Rolling walker (2 wheels) Gait Pattern/deviations: WFL(Within Functional Limits), Step-to pattern Gait velocity: 0.72ms     General Gait Details: cues to attempt step through gait without success yet, but pt able to correct step length assymetry   Stairs             Wheelchair Mobility    Modified Rankin (Stroke Patients Only)       Balance Overall balance assessment: Modified Independent                                          Cognition Arousal/Alertness: Awake/alert Behavior During Therapy: WFL for tasks assessed/performed Overall Cognitive Status: Within Functional Limits for tasks assessed                                          Exercises Total Joint Exercises Quad Sets: AROM, Both, 5 reps Gluteal Sets: AROM, Both, 5 reps Towel Squeeze: AROM, Both, 5 reps Short Arc Quad: AROM, Right, 5 reps Heel Slides: AROM, Both,  5 reps Hip ABduction/ADduction: AROM, Right, 5 reps Long Arc Quad: AROM, Both, 10 reps, Seated    General Comments        Pertinent Vitals/Pain Pain Assessment Pain Assessment: 0-10 Pain Score: 2  Pain Location: Rt knee/hip Pain Intervention(s): Limited activity within patient's tolerance, Monitored during session, Premedicated before session    Home Living Family/patient expects to be discharged to:: Private residence [facility]) Living Arrangements: Alone Available Help at Discharge: Family Type of Home: House Home  Access: Ramped entrance       Home Layout: One level Home Equipment: Rollator (4 wheels);Tub bench;Grab bars - tub/shower;Adaptive equipment Additional Comments: [facility] Participant- goes Tuesdays and 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[facility])    Prior Function            PT Goals (current goals can now be found in the care plan section) Acute Rehab PT Goals Patient Stated Goal: Regain AMB and independence for living at home PT Goal Formulation: With patient Time For Goal Achievement: 04/11/22 Potential to Achieve Goals: Good Progress towards PT goals: Progressing toward goals    Frequency    BID      PT Plan Current plan remains appropriate    Co-evaluation              AM-PAC PT "6 Clicks" Mobility   Outcome Measure  Help needed turning from your back to your side while in a flat bed without using bedrails?: A Lot Help needed moving from lying on your back to sitting on the side of a flat bed without using bedrails?: A Lot Help needed moving to and from a bed to a chair (including a wheelchair)?: A Little Help needed standing up from a chair using your arms (e.g., wheelchair or bedside chair)?: A Little Help needed to walk in hospital room?: A Little Help needed climbing 3-5 steps with a railing? : A Lot 6 Click Score: 15    End of Session Equipment Utilized During Treatment: Gait belt Activity Tolerance: Patient tolerated treatment well;No increased pain Patient left: with family/visitor present;in bed Nurse Communication: Mobility status PT Visit Diagnosis: Difficulty in walking, not elsewhere classified (R26.2);Unsteadiness on feet (R26.81);Muscle weakness (generalized) (M62.81)     Time: 4492-0100 PT Time Calculation (min) (ACUTE ONLY): 20 min  Charges:  $Gait Training: 8-22 mins $Therapeutic Exercise: 8-22 mins                    4:15 PM, 03/29/22 Etta Grandchild, PT, DPT Physical Therapist - [facility]   [phone] ([place])   Zo Loudon C 03/29/2022, 4:13 PM

## 2022-03-29 NOTE — Progress Notes (Signed)
Physical Therapy Treatment Patient Details Name: Kristina Medina MRN: [id number] DOB: [birth year] Today's Date: 03/29/2022   History of Present Illness Kristina Medina is a 30yoF who comes to [facility] on 03/28/22 for elective Rt THA c Dr. Marry Guan. Postoperatively pt has posterior hip precautions and is WBAT. Of note pat has fairly advanced DJD of bilat knees as well, degenerative valgus in Rt knee that makes hip precautions adherance difficulty to visualize or modify at times.    PT Comments    Pt already up to chair on entry, visiting with church peers, agreeable to session. Pain remain very minimal 2-3 when standing, 0 at rest seated. Pt still struggles with strength in transfers which makes maintaining precautions challenging. Pt progresses AMB in distance and in speed. Discussed helping make niece able to assist with toilet transfers here, she will be caregiver at DC. Reviewed precautions and started HEP education. Will see again in afternoon.      Recommendations for follow up therapy are one component of a multi-disciplinary discharge planning process, led by the attending physician.  Recommendations may be updated based on patient status, additional functional criteria and insurance authorization.  Follow Up Recommendations  Follow physician's recommendations for discharge plan and follow up therapies     Assistance Recommended at Discharge Intermittent Supervision/Assistance  Patient can return home with the following A little help with walking and/or transfers;A lot of help with bathing/dressing/bathroom;Assistance with cooking/housework;Assist for transportation;Help with stairs or ramp for entrance   Equipment Recommendations  Rolling walker (2 wheels)    Recommendations for Other Services       Precautions / Restrictions Precautions Precautions: Fall Restrictions RLE Weight Bearing: Weight bearing as tolerated     Mobility  Bed Mobility                     Transfers Overall transfer level: Needs assistance Equipment used: Rolling walker (2 wheels) Transfers: Sit to/from Stand Sit to Stand: Min guard           General transfer comment: still close to 70 degrees when transfering    Ambulation/Gait Ambulation/Gait assistance: Supervision Gait Distance (Feet): 170 Feet Assistive device: Rolling walker (2 wheels) Gait Pattern/deviations: WFL(Within Functional Limits), Step-to pattern Gait velocity: 0.53ms     General Gait Details: speed improving, conssitent energy levels adn posture.   Stairs             Wheelchair Mobility    Modified Rankin (Stroke Patients Only)       Balance Overall balance assessment: Modified Independent                                          Cognition Arousal/Alertness: Awake/alert Behavior During Therapy: WFL for tasks assessed/performed Overall Cognitive Status: Within Functional Limits for tasks assessed                                          Exercises Total Joint Exercises Long Arc Quad: AROM, Both, 10 reps, Seated    General Comments        Pertinent Vitals/Pain Pain Assessment Pain Assessment: 0-10 Pain Score: 2  Pain Location: Rt knee/hip Pain Intervention(s): Limited activity within patient's tolerance, Monitored during session, Premedicated before session    Home Living  Prior Function            PT Goals (current goals can now be found in the care plan section) Acute Rehab PT Goals Patient Stated Goal: Regain AMB and independence for living at home PT Goal Formulation: With patient Time For Goal Achievement: 04/11/22 Potential to Achieve Goals: Good Progress towards PT goals: Progressing toward goals    Frequency    BID      PT Plan Current plan remains appropriate    Co-evaluation              AM-PAC PT "6 Clicks" Mobility   Outcome Measure  Help needed turning  from your back to your side while in a flat bed without using bedrails?: A Lot Help needed moving from lying on your back to sitting on the side of a flat bed without using bedrails?: A Lot Help needed moving to and from a bed to a chair (including a wheelchair)?: A Little Help needed standing up from a chair using your arms (e.g., wheelchair or bedside chair)?: A Little Help needed to walk in hospital room?: A Little Help needed climbing 3-5 steps with a railing? : A Lot 6 Click Score: 15    End of Session Equipment Utilized During Treatment: Gait belt Activity Tolerance: Patient tolerated treatment well;No increased pain Patient left: with family/visitor present;in chair;with chair alarm set;with call bell/phone within reach Nurse Communication: Mobility status PT Visit Diagnosis: Difficulty in walking, not elsewhere classified (R26.2);Unsteadiness on feet (R26.81);Muscle weakness (generalized) (M62.81)     Time: 2683-4196 PT Time Calculation (min) (ACUTE ONLY): 29 min  Charges:  $Gait Training: 8-22 mins $Therapeutic Exercise: 8-22 mins                    1:20 PM, 03/29/22 Etta Grandchild, PT, DPT Physical Therapist - [employer]  [phone] ([place])    Kamarii Buren C 03/29/2022, 1:17 PM

## 2022-03-29 NOTE — Anesthesia Postprocedure Evaluation (Signed)
Anesthesia Post Note  Patient: Kristina Medina  Procedure(s) Performed: TOTAL HIP ARTHROPLASTY (Right: Hip)  Patient location during evaluation: Nursing Unit Anesthesia Type: Spinal Level of consciousness: awake Pain management: pain level controlled Respiratory status: spontaneous breathing Cardiovascular status: stable Postop Assessment: no headache Anesthetic complications: no   No notable events documented.   Last Vitals:  Vitals:   03/29/22 0031 03/29/22 0510  BP: (!) 143/60 (!) 179/70  Pulse: 83 90  Resp: 16 17  Temp: 36.7 C 36.7 C  SpO2: 98% 99%    Last Pain:  Vitals:   03/28/22 2045  TempSrc:   PainSc: 0-No pain                 Lerry Liner

## 2022-03-30 LAB — SURGICAL PATHOLOGY

## 2022-05-31 IMAGING — MG MM DIGITAL SCREENING BILAT W/ TOMO AND CAD
8 of 14 series · 8 of 40 positions shown · non-contrast
Comparison: Previous exam(s).

CLINICAL DATA: Screening. History of RIGHT breast cancer and
lumpectomy in 0185.

EXAM:
DIGITAL SCREENING BILATERAL MAMMOGRAM WITH TOMOSYNTHESIS AND CAD
TECHNIQUE: Bilateral screening digital craniocaudal and mediolateral oblique
mammograms were obtained. Bilateral screening digital breast
tomosynthesis was performed. The images were evaluated with
computer-aided detection.

[L MLO synth-2D]
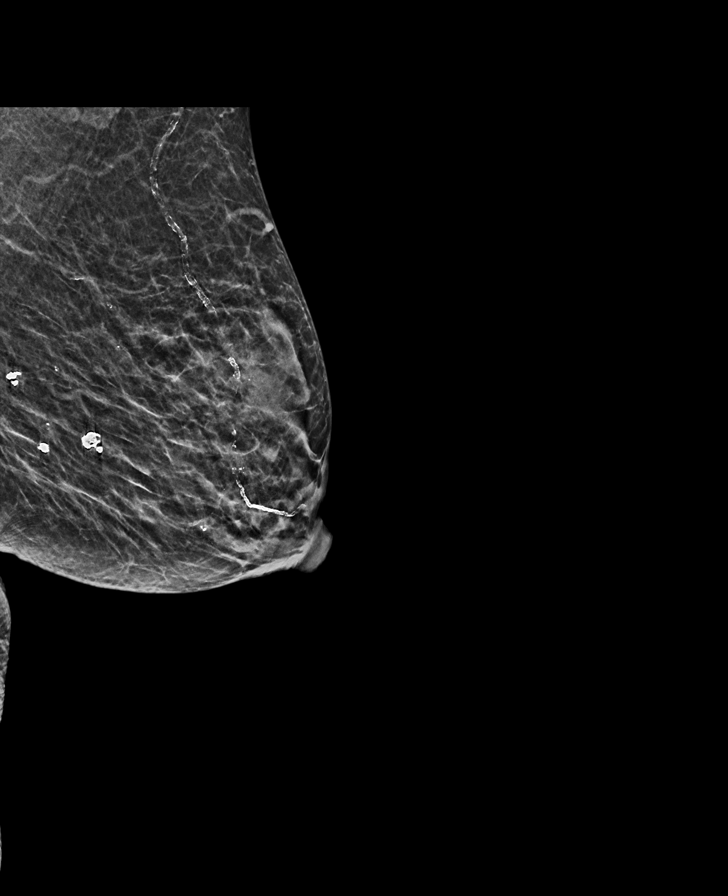

[R CC synth-2D (1 of 2)]
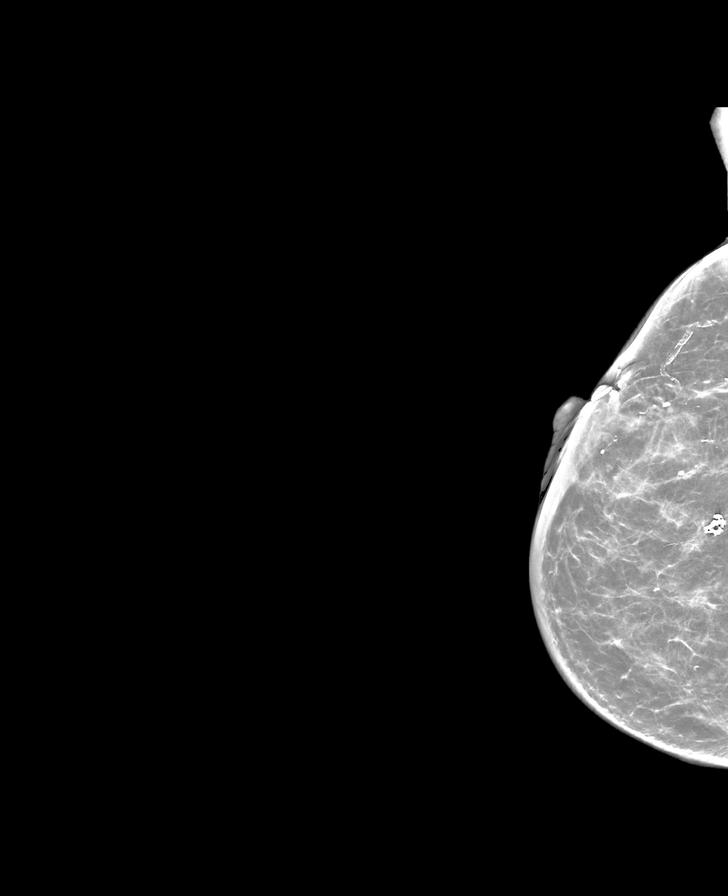

[R MLO synth-2D (1 of 2)]
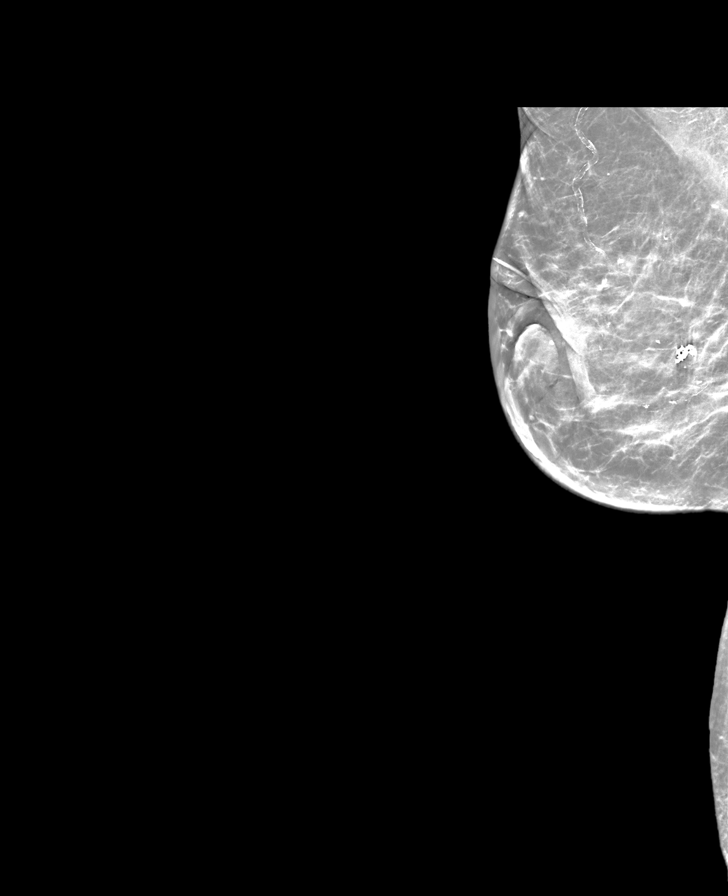

[R MLO synth-2D (2 of 2)]
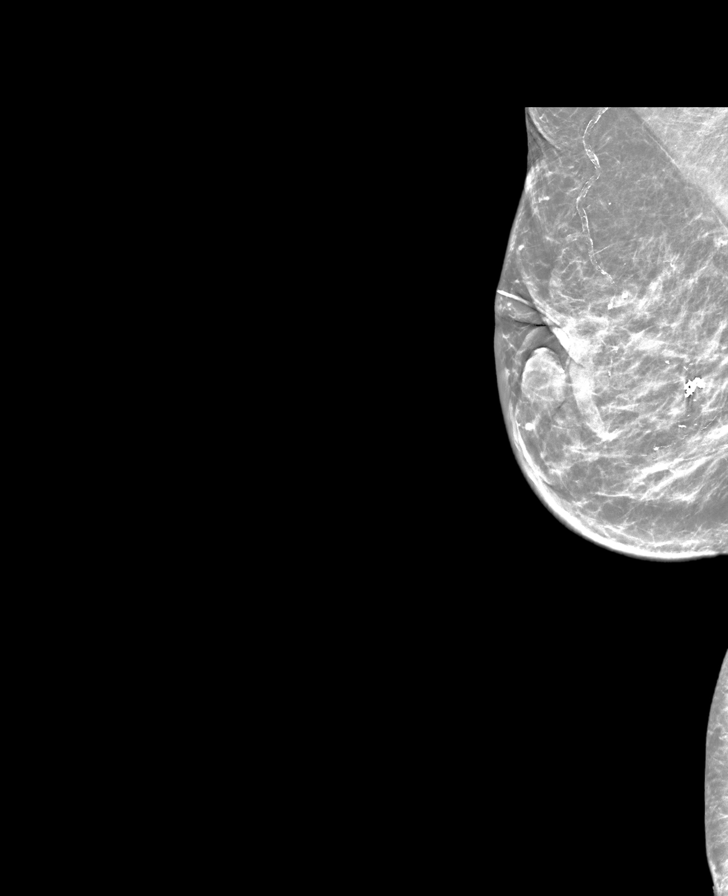

[L CC synth-2D (1 of 2)]
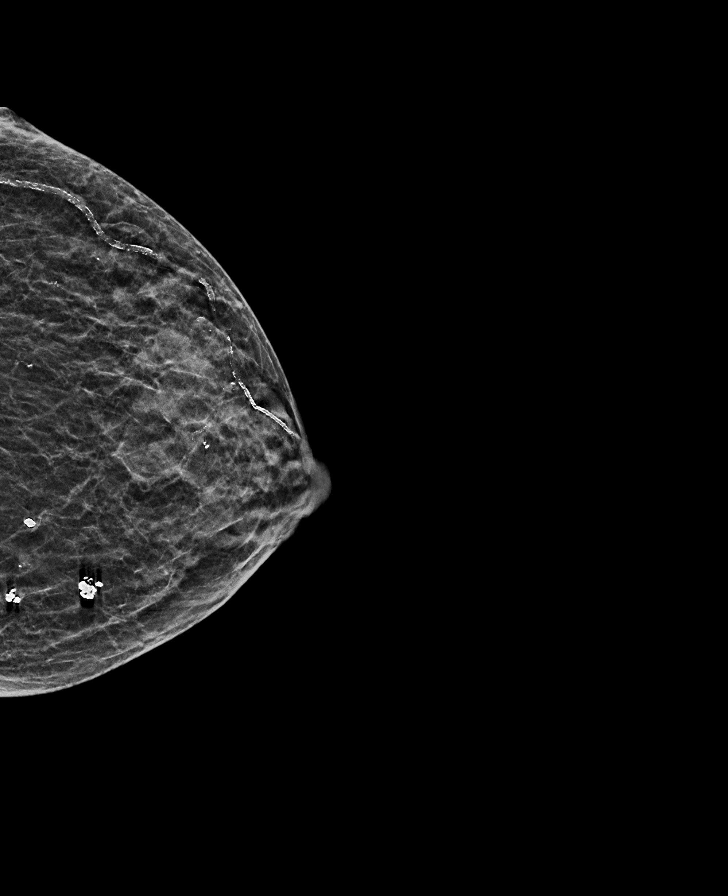

[L CC synth-2D (2 of 2)]
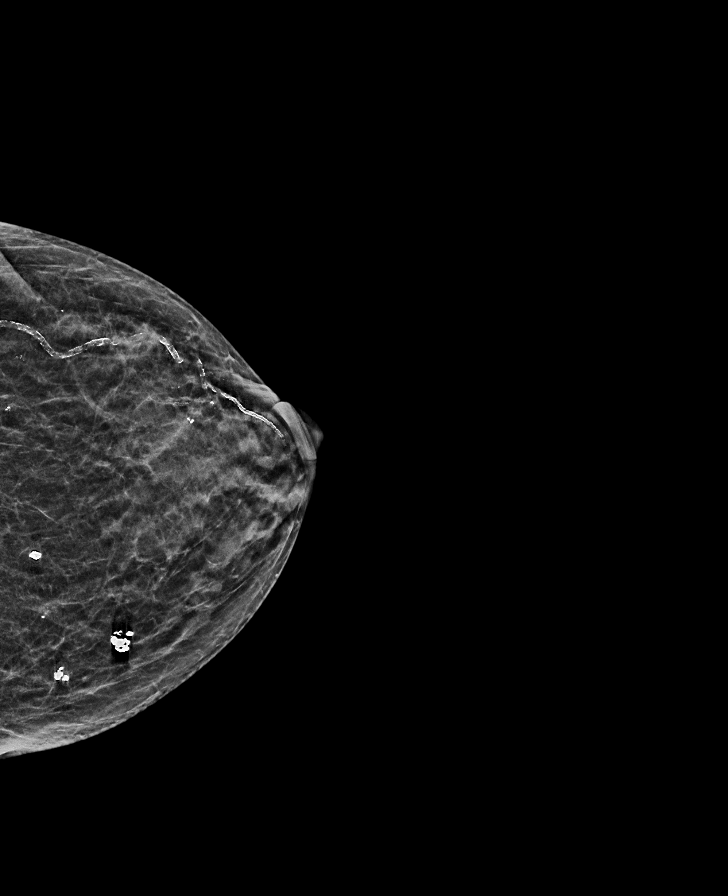

[R CC synth-2D (2 of 2)]
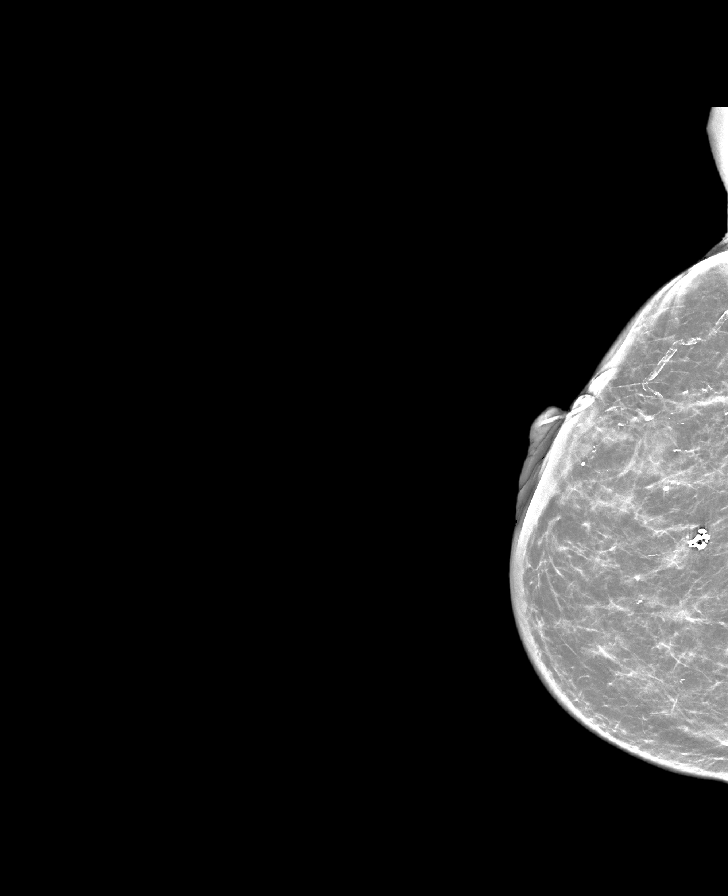

[R CC tomo · tomo slice 31/60.0]
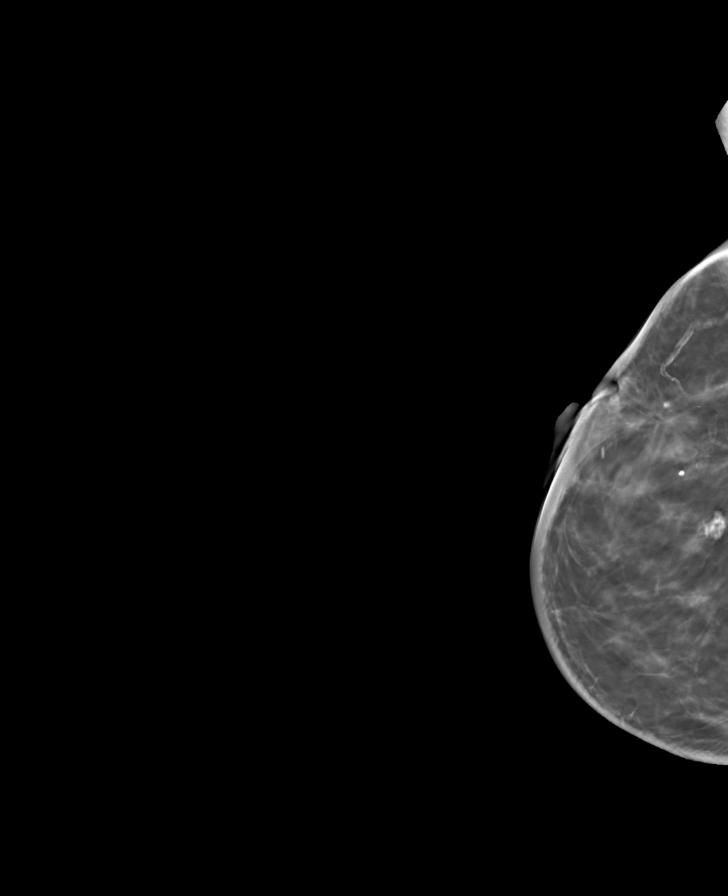

[8 of 40 positions shown; findings below may reference images not displayed]

ACR Breast Density Category c: The breast tissue is heterogeneously
dense, which may obscure small masses.
FINDINGS: There are no findings suspicious for malignancy. RIGHT lumpectomy
changes are again noted.
IMPRESSION: No mammographic evidence of malignancy. A result letter of this
screening mammogram will be mailed directly to the patient.

RECOMMENDATION:
Screening mammogram in one year as clinically indicated.
(Code:SJ-X-KXY)

BI-RADS CATEGORY  2: Benign.

## 2022-10-03 ENCOUNTER — Other Ambulatory Visit: Payer: Self-pay | Admitting: Family Medicine

## 2022-10-03 DIAGNOSIS — Z1231 Encounter for screening mammogram for malignant neoplasm of breast: Secondary | ICD-10-CM

## 2023-03-10 ENCOUNTER — Ambulatory Visit
Admission: RE | Admit: 2023-03-10 | Discharge: 2023-03-10 | Disposition: A | Discharge status: Home / Self Care | Payer: Medicare (Managed Care) | Source: Ambulatory Visit | Attending: Family Medicine

## 2023-03-10 DIAGNOSIS — Z1231 Encounter for screening mammogram for malignant neoplasm of breast: Secondary | ICD-10-CM | POA: Insufficient documentation

## 2023-08-25 ENCOUNTER — Other Ambulatory Visit: Payer: Self-pay | Admitting: Family Medicine

## 2023-08-25 DIAGNOSIS — Z1231 Encounter for screening mammogram for malignant neoplasm of breast: Secondary | ICD-10-CM

## 2024-01-05 ENCOUNTER — Other Ambulatory Visit: Payer: Self-pay | Admitting: Family Medicine

## 2024-01-05 DIAGNOSIS — M7989 Other specified soft tissue disorders: Secondary | ICD-10-CM

## 2024-01-08 ENCOUNTER — Other Ambulatory Visit: Payer: Self-pay | Admitting: Family Medicine

## 2024-01-08 DIAGNOSIS — M7989 Other specified soft tissue disorders: Secondary | ICD-10-CM

## 2024-01-22 ENCOUNTER — Ambulatory Visit
Admission: RE | Admit: 2024-01-22 | Discharge: 2024-01-22 | Disposition: A | Discharge status: Home / Self Care | Payer: Medicare (Managed Care) | Source: Ambulatory Visit | Attending: Family Medicine | Admitting: Family Medicine

## 2024-01-22 DIAGNOSIS — I700 Atherosclerosis of aorta: Secondary | ICD-10-CM | POA: Insufficient documentation

## 2024-01-22 DIAGNOSIS — J479 Bronchiectasis, uncomplicated: Secondary | ICD-10-CM | POA: Insufficient documentation

## 2024-01-22 DIAGNOSIS — R918 Other nonspecific abnormal finding of lung field: Secondary | ICD-10-CM | POA: Insufficient documentation

## 2024-01-22 DIAGNOSIS — M7989 Other specified soft tissue disorders: Secondary | ICD-10-CM | POA: Insufficient documentation

## 2024-01-22 DIAGNOSIS — K449 Diaphragmatic hernia without obstruction or gangrene: Secondary | ICD-10-CM | POA: Insufficient documentation

## 2024-01-22 MED ORDER — IOHEXOL 300 MG/ML  SOLN
60.0000 mL | Freq: Once | INTRAMUSCULAR | Status: AC | PRN
  Administered 2024-01-22: 60 mL via INTRAVENOUS

## 2024-03-11 ENCOUNTER — Ambulatory Visit
Admission: RE | Admit: 2024-03-11 | Discharge: 2024-03-11 | Disposition: A | Discharge status: Home / Self Care | Source: Ambulatory Visit | Attending: Family Medicine | Admitting: Family Medicine

## 2024-03-11 DIAGNOSIS — Z1231 Encounter for screening mammogram for malignant neoplasm of breast: Secondary | ICD-10-CM | POA: Diagnosis present

## 2024-03-13 ENCOUNTER — Encounter: Payer: Self-pay | Admitting: Family Medicine

## 2024-03-15 ENCOUNTER — Other Ambulatory Visit: Payer: Self-pay | Admitting: Family Medicine

## 2024-03-15 DIAGNOSIS — R928 Other abnormal and inconclusive findings on diagnostic imaging of breast: Secondary | ICD-10-CM

## 2024-03-18 ENCOUNTER — Ambulatory Visit
Admission: RE | Admit: 2024-03-18 | Discharge: 2024-03-18 | Disposition: A | Discharge status: Home / Self Care | Source: Ambulatory Visit | Attending: Family Medicine | Admitting: Family Medicine

## 2024-03-18 DIAGNOSIS — R928 Other abnormal and inconclusive findings on diagnostic imaging of breast: Secondary | ICD-10-CM | POA: Insufficient documentation
